# Patient Record
Sex: Female | Born: 1975 | Race: Black or African American | Hispanic: No | Marital: Single | Smoking: Never smoker
Health system: Southern US, Community
[De-identification: ages and names within clinical notes are randomized; demographics above are authoritative.]

## PROBLEM LIST (undated history)

## (undated) DIAGNOSIS — R011 Cardiac murmur, unspecified: Secondary | ICD-10-CM

## (undated) DIAGNOSIS — I10 Essential (primary) hypertension: Secondary | ICD-10-CM

## (undated) HISTORY — DX: Cardiac murmur, unspecified: R01.1

## (undated) HISTORY — PX: ABDOMINAL HYSTERECTOMY: SHX81

---

## 2001-12-21 ENCOUNTER — Other Ambulatory Visit: Admission: RE | Admit: 2001-12-21 | Discharge: 2001-12-21 | Payer: Self-pay

## 2002-01-16 ENCOUNTER — Emergency Department (HOSPITAL_COMMUNITY): Admission: EM | Admit: 2002-01-16 | Discharge: 2002-01-16 | Payer: Self-pay | Admitting: Emergency Medicine

## 2003-02-28 ENCOUNTER — Other Ambulatory Visit: Admission: RE | Admit: 2003-02-28 | Discharge: 2003-02-28 | Payer: Self-pay

## 2008-06-02 ENCOUNTER — Ambulatory Visit: Payer: Self-pay | Admitting: Physician Assistant

## 2008-06-02 ENCOUNTER — Inpatient Hospital Stay (HOSPITAL_COMMUNITY): Admission: AD | Admit: 2008-06-02 | Discharge: 2008-06-02 | Payer: Self-pay | Admitting: Obstetrics & Gynecology

## 2009-09-20 ENCOUNTER — Encounter: Payer: Self-pay | Admitting: Cardiology

## 2010-09-02 LAB — URINALYSIS, ROUTINE W REFLEX MICROSCOPIC
Bilirubin Urine: NEGATIVE
Glucose, UA: NEGATIVE mg/dL
Ketones, ur: NEGATIVE mg/dL
Nitrite: NEGATIVE
Protein, ur: NEGATIVE mg/dL
Specific Gravity, Urine: 1.015 (ref 1.005–1.030)
Urobilinogen, UA: 0.2 mg/dL (ref 0.0–1.0)
pH: 5.5 (ref 5.0–8.0)

## 2010-09-02 LAB — GC/CHLAMYDIA PROBE AMP, GENITAL
Chlamydia, DNA Probe: NEGATIVE
GC Probe Amp, Genital: NEGATIVE

## 2010-09-02 LAB — URINE MICROSCOPIC-ADD ON

## 2010-09-02 LAB — WET PREP, GENITAL: Yeast Wet Prep HPF POC: NONE SEEN

## 2010-09-02 LAB — POCT PREGNANCY, URINE: Preg Test, Ur: NEGATIVE

## 2011-08-27 ENCOUNTER — Encounter: Payer: Self-pay | Admitting: *Deleted

## 2013-06-12 ENCOUNTER — Encounter (HOSPITAL_BASED_OUTPATIENT_CLINIC_OR_DEPARTMENT_OTHER): Payer: Self-pay | Admitting: Emergency Medicine

## 2013-06-12 ENCOUNTER — Emergency Department (HOSPITAL_BASED_OUTPATIENT_CLINIC_OR_DEPARTMENT_OTHER)
Admission: EM | Admit: 2013-06-12 | Discharge: 2013-06-12 | Disposition: A | Discharge status: Home / Self Care | Payer: Medicaid Other | Attending: Emergency Medicine | Admitting: Emergency Medicine

## 2013-06-12 DIAGNOSIS — IMO0002 Reserved for concepts with insufficient information to code with codable children: Secondary | ICD-10-CM | POA: Insufficient documentation

## 2013-06-12 DIAGNOSIS — M2550 Pain in unspecified joint: Secondary | ICD-10-CM | POA: Insufficient documentation

## 2013-06-12 DIAGNOSIS — J111 Influenza due to unidentified influenza virus with other respiratory manifestations: Secondary | ICD-10-CM | POA: Insufficient documentation

## 2013-06-12 DIAGNOSIS — R112 Nausea with vomiting, unspecified: Secondary | ICD-10-CM | POA: Insufficient documentation

## 2013-06-12 DIAGNOSIS — IMO0001 Reserved for inherently not codable concepts without codable children: Secondary | ICD-10-CM | POA: Insufficient documentation

## 2013-06-12 DIAGNOSIS — I10 Essential (primary) hypertension: Secondary | ICD-10-CM | POA: Insufficient documentation

## 2013-06-12 DIAGNOSIS — R000 Tachycardia, unspecified: Secondary | ICD-10-CM | POA: Insufficient documentation

## 2013-06-12 DIAGNOSIS — B349 Viral infection, unspecified: Secondary | ICD-10-CM

## 2013-06-12 DIAGNOSIS — R6889 Other general symptoms and signs: Secondary | ICD-10-CM

## 2013-06-12 DIAGNOSIS — R011 Cardiac murmur, unspecified: Secondary | ICD-10-CM | POA: Insufficient documentation

## 2013-06-12 DIAGNOSIS — B9789 Other viral agents as the cause of diseases classified elsewhere: Secondary | ICD-10-CM | POA: Insufficient documentation

## 2013-06-12 DIAGNOSIS — Z79899 Other long term (current) drug therapy: Secondary | ICD-10-CM | POA: Insufficient documentation

## 2013-06-12 DIAGNOSIS — Z91040 Latex allergy status: Secondary | ICD-10-CM | POA: Insufficient documentation

## 2013-06-12 HISTORY — DX: Essential (primary) hypertension: I10

## 2013-06-12 MED ORDER — FLUTICASONE PROPIONATE 50 MCG/ACT NA SUSP: 2.0000 | Freq: Every day | NASAL | Status: DC

## 2013-06-12 MED ORDER — BENZONATATE 100 MG PO CAPS: 100.0000 mg | ORAL_CAPSULE | Freq: Three times a day (TID) | ORAL | Status: DC

## 2013-06-12 NOTE — Discharge Instructions (Signed)
Take tessalon as directed as needed for cough. Use flonase as directed. Rest, stay well hydrated. Alternate tylenol and ibuprofen every 6 hours for fever.  Influenza, Adult Influenza ("the flu") is a viral infection of the respiratory tract. It occurs more often in winter months because people spend more time in close contact with one another. Influenza can make you feel very sick. Influenza easily spreads from person to person (contagious). CAUSES  Influenza is caused by a virus that infects the respiratory tract. You can catch the virus by breathing in droplets from an infected person's cough or sneeze. You can also catch the virus by touching something that was recently contaminated with the virus and then touching your mouth, nose, or eyes. SYMPTOMS  Symptoms typically last 4 to 10 days and may include:  Fever.  Chills.  Headache, body aches, and muscle aches.  Sore throat.  Chest discomfort and cough.  Poor appetite.  Weakness or feeling tired.  Dizziness.  Nausea or vomiting. DIAGNOSIS  Diagnosis of influenza is often made based on your history and a physical exam. A nose or throat swab test can be done to confirm the diagnosis. RISKS AND COMPLICATIONS You may be at risk for a more severe case of influenza if you smoke cigarettes, have diabetes, have chronic heart disease (such as heart failure) or lung disease (such as asthma), or if you have a weakened immune system. Elderly people and pregnant women are also at risk for more serious infections. The most common complication of influenza is a lung infection (pneumonia). Sometimes, this complication can require emergency medical care and may be life-threatening. PREVENTION  An annual influenza vaccination (flu shot) is the best way to avoid getting influenza. An annual flu shot is now routinely recommended for all adults in the [place] TREATMENT  In mild cases, influenza goes away on its own. Treatment is directed at relieving  symptoms. For more severe cases, your caregiver may prescribe antiviral medicines to shorten the sickness. Antibiotic medicines are not effective, because the infection is caused by a virus, not by bacteria. HOME CARE INSTRUCTIONS  Only take over-the-counter or prescription medicines for pain, discomfort, or fever as directed by your caregiver.  Use a cool mist humidifier to make breathing easier.  Get plenty of rest until your temperature returns to normal. This usually takes 3 to 4 days.  Drink enough fluids to keep your urine clear or pale yellow.  Cover your mouth and nose when coughing or sneezing, and wash your hands well to avoid spreading the virus.  Stay home from work or school until your fever has been gone for at least 1 full day. SEEK MEDICAL CARE IF:   You have chest pain or a deep cough that worsens or produces more mucus.  You have nausea, vomiting, or diarrhea. SEEK IMMEDIATE MEDICAL CARE IF:   You have difficulty breathing, shortness of breath, or your skin or nails turn bluish.  You have severe neck pain or stiffness.  You have a severe headache, facial pain, or earache.  You have a worsening or recurring fever.  You have nausea or vomiting that cannot be controlled. MAKE SURE YOU:  Understand these instructions.  Will watch your condition.  Will get help right away if you are not doing well or get worse. Document Released: 05/02/2000 Document Revised: 11/04/2011 Document Reviewed: 08/04/2011 [facility] Patient Information 2014 [place], [place].  Viral Infections A viral infection can be caused by different types of viruses.Most viral infections are not serious  and resolve on their own. However, some infections may cause severe symptoms and may lead to further complications. SYMPTOMS Viruses can frequently cause:  Minor sore throat.  Aches and pains.  Headaches.  Runny nose.  Different types of rashes.  Watery  eyes.  Tiredness.  Cough.  Loss of appetite.  Gastrointestinal infections, resulting in nausea, vomiting, and diarrhea. These symptoms do not respond to antibiotics because the infection is not caused by bacteria. However, you might catch a bacterial infection following the viral infection. This is sometimes called a "superinfection." Symptoms of such a bacterial infection may include:  Worsening sore throat with pus and difficulty swallowing.  Swollen neck glands.  Chills and a high or persistent fever.  Severe headache.  Tenderness over the sinuses.  Persistent overall ill feeling (malaise), muscle aches, and tiredness (fatigue).  Persistent cough.  Yellow, green, or brown mucus production with coughing. HOME CARE INSTRUCTIONS   Only take over-the-counter or prescription medicines for pain, discomfort, diarrhea, or fever as directed by your caregiver.  Drink enough water and fluids to keep your urine clear or pale yellow. Sports drinks can provide valuable electrolytes, sugars, and hydration.  Get plenty of rest and maintain proper nutrition. Soups and broths with crackers or rice are fine. SEEK IMMEDIATE MEDICAL CARE IF:   You have severe headaches, shortness of breath, chest pain, neck pain, or an unusual rash.  You have uncontrolled vomiting, diarrhea, or you are unable to keep down fluids.  You or your child has an oral temperature above 102 F (38.9 C), not controlled by medicine.  Your baby is older than 3 months with a rectal temperature of 102 F (38.9 C) or higher.  Your baby is 47 months old or younger with a rectal temperature of 100.4 F (38 C) or higher. MAKE SURE YOU:   Understand these instructions.  Will watch your condition.  Will get help right away if you are not doing well or get worse. Document Released: 02/12/2005 Document Revised: 07/28/2011 Document Reviewed: 09/09/2010 [facility] Patient Information 2014 [place], [place].

## 2013-06-12 NOTE — ED Notes (Signed)
Friday was having N/V and now headache, cough, congestion, general body aches, and fever

## 2013-06-12 NOTE — ED Notes (Signed)
Pt complains of GI symptoms on fri and sat but nothing since.  Now complains of body aches and cough.  Denies n/v/d at this time.

## 2013-06-12 NOTE — ED Provider Notes (Signed)
CSN: [id number]     Arrival date & time 06/12/13  2119 History   First MD Initiated Contact with Patient 06/12/13 2120     Chief Complaint  Patient presents with  . Influenza   (Consider location/radiation/quality/duration/timing/severity/associated sxs/prior Treatment) HPI Comments: Pt is a 38 y/o female who presents to the ED complaining of generalized body aches, fever, n/v, cough and congestion x 3 days. Pt states she initially had n/v 3 days ago, had a few episodes yesterday and has since subsided she has no appetite. Yesterday she began to feel congested in her chest, cough and body aches. 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 She has tried OTC medications with minimal relief. Denies cp, sob, diarrhea, abdominal pain.  Patient is a 38 y.o. female presenting with flu symptoms. The history is provided by the patient.  Influenza Presenting symptoms: cough, fatigue, fever, myalgias, nausea and vomiting   Associated symptoms: nasal congestion     Past Medical History  Diagnosis Date  . Heart murmur   . Hypertension    History reviewed. No pertinent past surgical history. Family History  Problem Relation Age of Onset  . Hypertension Mother   . Hypertension Father    History  Substance Use Topics  . Smoking status: Never Smoker   . Smokeless tobacco: Not on file  . Alcohol Use: No   OB History   Grav Para Term Preterm Abortions TAB SAB Ect Mult Living                 Review of Systems  Constitutional: Positive for fever, appetite change and fatigue.  HENT: Positive for congestion.   Respiratory: Positive for cough.   Gastrointestinal: Positive for nausea and vomiting.  Musculoskeletal: Positive for arthralgias and myalgias.  All other systems reviewed and are negative.    Allergies  Latex and Sulfa antibiotics  Home Medications   Current Outpatient Rx  Name  Route  Sig  Dispense  Refill  . lisinopril-hydrochlorothiazide (PRINZIDE,ZESTORETIC) 20-25 MG per  tablet   Oral   Take 1 tablet by mouth daily.         . meclizine (ANTIVERT) 12.5 MG tablet   Oral   Take 12.5 mg by mouth 3 (three) times daily as needed for dizziness.         . megestrol (MEGACE) 40 MG tablet   Oral   Take 40 mg by mouth daily.         . Multiple Vitamin (MULTIVITAMIN) capsule   Oral   Take 1 capsule by mouth daily.         . benzonatate (TESSALON) 100 MG capsule   Oral   Take 1 capsule (100 mg total) by mouth every 8 (eight) hours.   21 capsule   0   . fluticasone (FLONASE) 50 MCG/ACT nasal spray   Each Nare   Place 2 sprays into both nostrils daily.   16 g   0    BP 125/77  Pulse 112  Temp(Src) 100.7 F (38.2 C) (Oral)  Resp 16  Ht 5\' 7"  (1.702 m)  Wt 213 lb (96.616 kg)  BMI 33.35 kg/m2  SpO2 99% Physical Exam  Nursing note and vitals reviewed. Constitutional: She is oriented to person, place, and time. She appears well-developed and well-nourished. No distress.  HENT:  Head: Normocephalic and atraumatic.  Nose: Mucosal edema present. Right sinus exhibits maxillary sinus tenderness and frontal sinus tenderness. Left sinus exhibits maxillary sinus tenderness and frontal sinus tenderness.  Mouth/Throat: Mucous membranes are normal. Posterior oropharyngeal erythema present. No oropharyngeal exudate or posterior oropharyngeal edema.  Eyes: Conjunctivae are normal.  Neck: Normal range of motion. Neck supple.  Cardiovascular: Regular rhythm and normal heart sounds.  Tachycardia present.   Pulmonary/Chest: Effort normal and breath sounds normal. No respiratory distress. She has no wheezes. She has no rhonchi.  Abdominal: Soft. Normal appearance and bowel sounds are normal. She exhibits no distension. There is no tenderness. There is no rigidity, no rebound and no guarding.  Musculoskeletal: Normal range of motion. She exhibits no edema.  Neurological: She is alert and oriented to person, place, and time.  Skin: Skin is warm and dry. She is  not diaphoretic.  Psychiatric: She has a normal mood and affect. Her behavior is normal.    ED Course  Procedures (including critical care time) Labs Review Labs Reviewed - No data to display Imaging Review No results found.  EKG Interpretation   None       MDM   1. Flu-like symptoms   2. Viral syndrome    Pt presenting with flu-like symptoms. She is well appearing and in NAD. Febrile at 100.7, tachycardic ~110. Lungs clear. O2 sat 99% on RA. Tolerating PO fluids without difficulty. Discussed symptomatic tx. Flonase and tessalon given. She is stable for discharge. Return precautions given. Patient states understanding of treatment care plan and is agreeable.     Trevor Mace, PA-C 06/12/13 2146

## 2013-06-13 NOTE — ED Provider Notes (Signed)
Medical screening examination/treatment/procedure(s) were performed by non-physician practitioner and as supervising physician I was immediately available for consultation/collaboration.  EKG Interpretation   None         Shelda JakesScott W. Bryndon Cumbie, MD 06/13/13 [phone]

## 2013-06-23 ENCOUNTER — Encounter: Payer: Self-pay | Admitting: Cardiology

## 2013-08-03 ENCOUNTER — Emergency Department (HOSPITAL_BASED_OUTPATIENT_CLINIC_OR_DEPARTMENT_OTHER): Payer: Medicaid Other

## 2013-08-03 ENCOUNTER — Emergency Department (HOSPITAL_BASED_OUTPATIENT_CLINIC_OR_DEPARTMENT_OTHER)
Admission: EM | Admit: 2013-08-03 | Discharge: 2013-08-03 | Disposition: A | Discharge status: Home / Self Care | Payer: Medicaid Other | Attending: Emergency Medicine | Admitting: Emergency Medicine

## 2013-08-03 DIAGNOSIS — IMO0002 Reserved for concepts with insufficient information to code with codable children: Secondary | ICD-10-CM | POA: Insufficient documentation

## 2013-08-03 DIAGNOSIS — R609 Edema, unspecified: Secondary | ICD-10-CM | POA: Insufficient documentation

## 2013-08-03 DIAGNOSIS — Z91040 Latex allergy status: Secondary | ICD-10-CM | POA: Insufficient documentation

## 2013-08-03 DIAGNOSIS — R011 Cardiac murmur, unspecified: Secondary | ICD-10-CM | POA: Insufficient documentation

## 2013-08-03 DIAGNOSIS — M79609 Pain in unspecified limb: Secondary | ICD-10-CM | POA: Insufficient documentation

## 2013-08-03 DIAGNOSIS — Z79899 Other long term (current) drug therapy: Secondary | ICD-10-CM | POA: Insufficient documentation

## 2013-08-03 DIAGNOSIS — E876 Hypokalemia: Secondary | ICD-10-CM | POA: Insufficient documentation

## 2013-08-03 DIAGNOSIS — I10 Essential (primary) hypertension: Secondary | ICD-10-CM | POA: Insufficient documentation

## 2013-08-03 DIAGNOSIS — M79606 Pain in leg, unspecified: Secondary | ICD-10-CM

## 2013-08-03 LAB — BASIC METABOLIC PANEL
BUN: 15 mg/dL (ref 6–23)
CALCIUM: 9.8 mg/dL (ref 8.4–10.5)
CO2: 28 mEq/L (ref 19–32)
Chloride: 95 mEq/L — ABNORMAL LOW (ref 96–112)
Creatinine, Ser: 0.8 mg/dL (ref 0.50–1.10)
Glucose, Bld: 113 mg/dL — ABNORMAL HIGH (ref 70–99)
POTASSIUM: 2.9 meq/L — AB (ref 3.7–5.3)
SODIUM: 138 meq/L (ref 137–147)

## 2013-08-03 LAB — D-DIMER, QUANTITATIVE (NOT AT ARMC): D DIMER QUANT: 1.87 ug{FEU}/mL — AB (ref 0.00–0.48)

## 2013-08-03 MED ORDER — POTASSIUM CHLORIDE 10 MEQ/100ML IV SOLN
INTRAVENOUS | Status: AC
  Administered 2013-08-03: 10 meq via INTRAVENOUS
  Filled 2013-08-03: qty 100

## 2013-08-03 MED ORDER — POTASSIUM CHLORIDE 20 MEQ/15ML (10%) PO LIQD
40.0000 meq | Freq: Once | ORAL | Status: AC
  Administered 2013-08-03: 40 meq via ORAL
  Filled 2013-08-03: qty 30

## 2013-08-03 MED ORDER — POTASSIUM CHLORIDE ER 10 MEQ PO TBCR: 10.0000 meq | EXTENDED_RELEASE_TABLET | Freq: Every day | ORAL | Status: DC

## 2013-08-03 MED ORDER — POTASSIUM CHLORIDE 10 MEQ/100ML IV SOLN
10.0000 meq | Freq: Once | INTRAVENOUS | Status: AC
  Administered 2013-08-03: 10 meq via INTRAVENOUS

## 2013-08-03 NOTE — ED Notes (Signed)
Pt sts she has hx of "sweling in legs", has been taking "water pills" and was told that if she had any pain in legs to come get checked for blood clots. Pain is in bilateral legs, described as sharp, in calves. R greater than L. Denies shob.

## 2013-08-03 NOTE — ED Notes (Signed)
Pt has bilateral 2+ pitting edema to lower legs and feet. Denies shob. No erythema, warmth to touch noted to lower legs.

## 2013-08-03 NOTE — Discharge Instructions (Signed)
Hypokalemia Hypokalemia means that the amount of potassium in the blood is lower than normal.Potassium is a chemical, called an electrolyte, that helps regulate the amount of fluid in the body. It also stimulates muscle contraction and helps nerves function properly.Most of the body's potassium is inside of cells, and only a very small amount is in the blood. Because the amount in the blood is so small, minor changes can be life-threatening. CAUSES  Antibiotics.  Diarrhea or vomiting.  Using laxatives too much, which can cause diarrhea.  Chronic kidney disease.  Water pills (diuretics).  Eating disorders (bulimia).  Low magnesium level.  Sweating a lot. SIGNS AND SYMPTOMS  Weakness.  Constipation.  Fatigue.  Muscle cramps.  Mental confusion.  Skipped heartbeats or irregular heartbeat (palpitations).  Tingling or numbness. DIAGNOSIS  Your health care provider can diagnose hypokalemia with blood tests. In addition to checking your potassium level, your health care provider may also check other lab tests. TREATMENT Hypokalemia can be treated with potassium supplements taken by mouth or adjustments in your current medicines. If your potassium level is very low, you may need to get potassium through a vein (IV) and be monitored in the hospital. A diet high in potassium is also helpful. Foods high in potassium are:  Nuts, such as peanuts and pistachios.  Seeds, such as sunflower seeds and pumpkin seeds.  Peas, lentils, and lima beans.  Whole grain and bran cereals and breads.  Fresh fruit and vegetables, such as apricots, avocado, bananas, cantaloupe, kiwi, oranges, tomatoes, asparagus, and potatoes.  Orange and tomato juices.  Red meats.  Fruit yogurt. HOME CARE INSTRUCTIONS  Take all medicines as prescribed by your health care provider.  Maintain a healthy diet by including nutritious food, such as fruits, vegetables, nuts, whole grains, and lean meats.  If  you are taking a laxative, be sure to follow the directions on the label. SEEK MEDICAL CARE IF:  Your weakness gets worse.  You feel your heart pounding or racing.  You are vomiting or having diarrhea.  You are diabetic and having trouble keeping your blood glucose in the normal range. SEEK IMMEDIATE MEDICAL CARE IF:  You have chest pain, shortness of breath, or dizziness.  You are vomiting or having diarrhea for more than 2 days.  You faint. MAKE SURE YOU:   Understand these instructions.  Will watch your condition.  Will get help right away if you are not doing well or get worse. Document Released: 05/05/2005 Document Revised: 02/23/2013 Document Reviewed: 11/05/2012 [employer] Patient Information 2014 [employer].  

## 2013-08-03 NOTE — ED Notes (Signed)
Lab called ot notify of critical potassium 2.9, Dr Wilkie AyeHorton made aware.

## 2013-08-03 NOTE — ED Notes (Signed)
Critical potassium called by lab, Dr Wilkie AyeHorton notified. K+ 2.9.

## 2013-08-03 NOTE — ED Provider Notes (Signed)
CSN: [id number]     Arrival date & time 08/03/13  0830 History   First MD Initiated Contact with Patient 08/03/13 [phone]     Chief Complaint  Patient presents with  . Leg Pain     (Consider location/radiation/quality/duration/timing/severity/associated sxs/prior Treatment) HPI  This is a 38 year old female who presents with bilateral leg pain and swelling. Patient reports she's been seen by her primary physician for increasing leg swelling since November. She was seen last week and her dose of Lasix was increased. Patient reports improvement of swelling since last week. However, patient reports over the last 24 hours she has noted sharp pains in the bilateral lower extremities. She denies any skin changes. She was told if she noted any pain or skin changes, she needs to be evaluated for clot. Patient reports that the pain is sharp and nonradiating. Currently the pain is 4/10. She denies any chest pain or shortness of breath. She denies any injury.  Past Medical History  Diagnosis Date  . Heart murmur   . Hypertension    No past surgical history on file. Family History  Problem Relation Age of Onset  . Hypertension Mother   . Hypertension Father    History  Substance Use Topics  . Smoking status: Never Smoker   . Smokeless tobacco: Not on file  . Alcohol Use: No   OB History   Grav Para Term Preterm Abortions TAB SAB Ect Mult Living                 Review of Systems  Constitutional: Negative for fever.  Respiratory: Negative for chest tightness and shortness of breath.   Cardiovascular: Positive for leg swelling. Negative for chest pain and palpitations.  Genitourinary: Negative for dysuria.  Skin: Negative for color change and rash.  Neurological: Negative for headaches.  All other systems reviewed and are negative.      Allergies  Latex and Sulfa antibiotics  Home Medications   Current Outpatient Rx  Name  Route  Sig  Dispense  Refill  . furosemide (LASIX) 40 MG  tablet   Oral   Take 40 mg by mouth 2 (two) times daily.         . benzonatate (TESSALON) 100 MG capsule   Oral   Take 1 capsule (100 mg total) by mouth every 8 (eight) hours.   21 capsule   0   . fluticasone (FLONASE) 50 MCG/ACT nasal spray   Each Nare   Place 2 sprays into both nostrils daily.   16 g   0   . lisinopril-hydrochlorothiazide (PRINZIDE,ZESTORETIC) 20-25 MG per tablet   Oral   Take 1 tablet by mouth daily.         . meclizine (ANTIVERT) 12.5 MG tablet   Oral   Take 12.5 mg by mouth 3 (three) times daily as needed for dizziness.         . megestrol (MEGACE) 40 MG tablet   Oral   Take 40 mg by mouth daily.         . Multiple Vitamin (MULTIVITAMIN) capsule   Oral   Take 1 capsule by mouth daily.         . potassium chloride (K-DUR) 10 MEQ tablet   Oral   Take 1 tablet (10 mEq total) by mouth daily.   30 tablet   0    BP 125/69  Pulse 89  Temp(Src) 97.7 F (36.5 C) (Oral)  Resp 16  Ht 5\' 6"  (1.676 m)  Wt 232 lb (105.235 kg)  BMI 37.46 kg/m2  SpO2 99% Physical Exam  Nursing note and vitals reviewed. Constitutional: She is oriented to person, place, and time. She appears well-developed and well-nourished. No distress.  HENT:  Head: Normocephalic and atraumatic.  Cardiovascular: Normal rate, regular rhythm and normal heart sounds.   No murmur heard. Pulmonary/Chest: Effort normal and breath sounds normal. No respiratory distress. She has no wheezes.  Abdominal: Soft. There is no tenderness.  Musculoskeletal: She exhibits edema.  2+ bilateral lower extremity edema conditions, symmetric, no significant calf tenderness  Neurological: She is alert and oriented to person, place, and time.  Skin: Skin is warm and dry.  Psychiatric: She has a normal mood and affect.    ED Course  Procedures (including critical care time) Labs Review Labs Reviewed  D-DIMER, QUANTITATIVE - Abnormal; Notable for the following:    D-Dimer, Quant 1.87 (*)     All other components within normal limits  BASIC METABOLIC PANEL - Abnormal; Notable for the following:    Potassium 2.9 (*)    Chloride 95 (*)    Glucose, Bld 113 (*)    All other components within normal limits   Imaging Review US Venous Img Lower Bilateral  08/03/2013   CLINICAL DATA:  Bilateral lower extremity pain and swelling  EXAM: Bilateral LOWER EXTREMITY VENOUS DOPPLER ULTRASOUND  TECHNIQUE: Gray-scale sonography with graded compression, as well as color Doppler and duplex ultrasound, were performed to evaluate the deep venous system from the level of the common femoral vein through the popliteal and proximal calf veins. Spectral Doppler was utilized to evaluate flow at rest and with distal augmentation maneuvers.  COMPARISON:  None.  FINDINGS: Thrombus within deep veins:  None visualized.  Compressibility of deep veins:  Normal.  Duplex waveform respiratory phasicity:  Normal.  Duplex waveform response to augmentation:  Normal.  Venous reflux:  None visualized.  Other findings: Incidental note is made of superficial edema associated with the left ankle.  IMPRESSION: There is no evidence of deep venous thrombosis in the right or left lower extremities.   Electronically Signed   By: David  [place]   On: 08/03/2013 10:56     EKG Interpretation None      MDM   Final diagnoses:  Leg pain  Hypokalemia    Patient presents with bilateral leg pain. Patient recently had diuretics increased by PCP. Was told to be evaluated for blood clots. Low suspicion for blood clots given the bilateral nature of symptoms. D-dimer was sent as well as a BMP to elevate for potassium abnormalities. Dimer is elevated and patient sent for ultrasound which is negative for DVT. Potassium noted to be 2.9. This is likely the cause of patient's lower extremity myalgias. Patient was given potassium by IV and PO.  Will start patient on K-Dur. Patient followup primary care physician for repeat BMP in one week.  After  history, exam, and medical workup I feel the patient has been appropriately medically screened and is safe for discharge home. Pertinent diagnoses were discussed with the patient. Patient was given return precautions.     Shon Baton, MD 08/03/13 [phone]

## 2013-11-22 ENCOUNTER — Ambulatory Visit (HOSPITAL_COMMUNITY)
Admission: RE | Admit: 2013-11-22 | Discharge: 2013-11-22 | Disposition: A | Discharge status: Home / Self Care | Payer: Medicaid Other | Source: Ambulatory Visit | Attending: Cardiology | Admitting: Cardiology

## 2013-11-22 DIAGNOSIS — I517 Cardiomegaly: Secondary | ICD-10-CM

## 2013-11-22 DIAGNOSIS — R609 Edema, unspecified: Secondary | ICD-10-CM | POA: Insufficient documentation

## 2013-11-22 NOTE — Progress Notes (Signed)
  Echocardiogram 2D Echocardiogram has been performed.  Rhea Thrun 11/22/2013, 9:02 AM

## 2014-08-01 ENCOUNTER — Emergency Department (HOSPITAL_BASED_OUTPATIENT_CLINIC_OR_DEPARTMENT_OTHER)
Admission: EM | Admit: 2014-08-01 | Discharge: 2014-08-01 | Disposition: A | Discharge status: Home / Self Care | Payer: 59 | Attending: Emergency Medicine | Admitting: Emergency Medicine

## 2014-08-01 ENCOUNTER — Encounter (HOSPITAL_BASED_OUTPATIENT_CLINIC_OR_DEPARTMENT_OTHER): Payer: Self-pay | Admitting: *Deleted

## 2014-08-01 ENCOUNTER — Emergency Department (HOSPITAL_BASED_OUTPATIENT_CLINIC_OR_DEPARTMENT_OTHER): Payer: 59

## 2014-08-01 DIAGNOSIS — M25562 Pain in left knee: Secondary | ICD-10-CM

## 2014-08-01 DIAGNOSIS — M25569 Pain in unspecified knee: Secondary | ICD-10-CM

## 2014-08-01 DIAGNOSIS — G8929 Other chronic pain: Secondary | ICD-10-CM | POA: Insufficient documentation

## 2014-08-01 DIAGNOSIS — S8992XA Unspecified injury of left lower leg, initial encounter: Secondary | ICD-10-CM | POA: Insufficient documentation

## 2014-08-01 DIAGNOSIS — Z79818 Long term (current) use of other agents affecting estrogen receptors and estrogen levels: Secondary | ICD-10-CM | POA: Insufficient documentation

## 2014-08-01 DIAGNOSIS — R011 Cardiac murmur, unspecified: Secondary | ICD-10-CM | POA: Diagnosis not present

## 2014-08-01 DIAGNOSIS — Z79899 Other long term (current) drug therapy: Secondary | ICD-10-CM | POA: Insufficient documentation

## 2014-08-01 DIAGNOSIS — Y998 Other external cause status: Secondary | ICD-10-CM | POA: Diagnosis not present

## 2014-08-01 DIAGNOSIS — Z7951 Long term (current) use of inhaled steroids: Secondary | ICD-10-CM | POA: Diagnosis not present

## 2014-08-01 DIAGNOSIS — Y9301 Activity, walking, marching and hiking: Secondary | ICD-10-CM | POA: Diagnosis not present

## 2014-08-01 DIAGNOSIS — X58XXXA Exposure to other specified factors, initial encounter: Secondary | ICD-10-CM | POA: Insufficient documentation

## 2014-08-01 DIAGNOSIS — Z91040 Latex allergy status: Secondary | ICD-10-CM | POA: Insufficient documentation

## 2014-08-01 DIAGNOSIS — Y9289 Other specified places as the place of occurrence of the external cause: Secondary | ICD-10-CM | POA: Insufficient documentation

## 2014-08-01 DIAGNOSIS — I10 Essential (primary) hypertension: Secondary | ICD-10-CM | POA: Diagnosis not present

## 2014-08-01 MED ORDER — HYDROCODONE-ACETAMINOPHEN 5-325 MG PO TABS: 1.0000 | ORAL_TABLET | Freq: Four times a day (QID) | ORAL | Status: DC | PRN

## 2014-08-01 MED ORDER — IBUPROFEN 800 MG PO TABS: 800.0000 mg | ORAL_TABLET | Freq: Three times a day (TID) | ORAL | Status: DC

## 2014-08-01 NOTE — ED Provider Notes (Signed)
CSN: [id number]     Arrival date & time 08/01/14  2025 History  This chart was scribed for Elwin MochaBlair Jaedynn Bohlken, MD by Gwenyth Oberatherine Macek, ED Scribe. This patient was seen in room MH01/MH01 and the patient's care was started at 8:46 PM.    Chief Complaint  Patient presents with  . Knee Pain   Patient is a 39 y.o. female presenting with knee pain. The history is provided by the patient. No language interpreter was used.  Knee Pain Location:  Knee Time since incident:  8 days Injury: no   Knee location:  L knee Pain details:    Quality:  Aching   Radiates to:  Does not radiate   Severity:  Moderate   Onset quality:  Gradual   Duration:  8 days   Timing:  Constant   Progression:  Unchanged Chronicity:  New Dislocation: no   Foreign body present:  No foreign bodies Tetanus status:  Unknown Prior injury to area:  No Relieved by:  None tried Worsened by:  Nothing tried Ineffective treatments:  None tried Associated symptoms: no back pain, no numbness and no tingling     HPI Comments: Patricia [place] is a 39 y.o. female who presents to the Emergency Department complaining of constant, moderate, sharp left knee pain that started 8 days ago. She states bilateral leg swelling as an associated symptom. She also notes a creaking sounds from her right knee as she walks up the stairs. Pt reports pain becomes worse with bending and squatting. She works in [employer]. Pt denies estrogen use, recent long travel and injuries or falls.   Past Medical History  Diagnosis Date  . Heart murmur   . Hypertension    Past Surgical History  Procedure Laterality Date  . Abdominal hysterectomy     Family History  Problem Relation Age of Onset  . Hypertension Mother   . Hypertension Father    History  Substance Use Topics  . Smoking status: Never Smoker   . Smokeless tobacco: Not on file  . Alcohol Use: No   OB History    No data available     Review of Systems  Cardiovascular: Positive for leg  swelling.  Musculoskeletal: Positive for arthralgias. Negative for back pain.  All other systems reviewed and are negative.  Allergies  Latex and Sulfa antibiotics  Home Medications   Prior to Admission medications   Medication Sig Start Date End Date Taking? Authorizing Provider  benzonatate (TESSALON) 100 MG capsule Take 1 capsule (100 mg total) by mouth every 8 (eight) hours. 06/12/13   Robyn M Hess, PA-C  fluticasone (FLONASE) 50 MCG/ACT nasal spray Place 2 sprays into both nostrils daily. 06/12/13   Robyn M Hess, PA-C  furosemide (LASIX) 40 MG tablet Take 40 mg by mouth 2 (two) times daily.    Historical Provider, MD  lisinopril-hydrochlorothiazide (PRINZIDE,ZESTORETIC) 20-25 MG per tablet Take 1 tablet by mouth daily.    Historical Provider, MD  meclizine (ANTIVERT) 12.5 MG tablet Take 12.5 mg by mouth 3 (three) times daily as needed for dizziness.    Historical Provider, MD  megestrol (MEGACE) 40 MG tablet Take 40 mg by mouth daily.    Historical Provider, MD  Multiple Vitamin (MULTIVITAMIN) capsule Take 1 capsule by mouth daily.    Historical Provider, MD  potassium chloride (K-DUR) 10 MEQ tablet Take 1 tablet (10 mEq total) by mouth daily. 08/03/13   Shon Batonourtney F Horton, MD   BP 152/80 mmHg  Pulse 89  Temp(Src)  98.4 F (36.9 C) (Oral)  Resp 20  Ht  (1.702 m)  Wt 230 lb (104.327 kg)  BMI 36.01 kg/m2  SpO2 99% Physical Exam  Constitutional: She is oriented to person, place, and time. She appears well-developed and well-nourished. No distress.  HENT:  Head: Normocephalic and atraumatic.  Mouth/Throat: Oropharynx is clear and moist.  Eyes: EOM are normal. Pupils are equal, round, and reactive to light.  Neck: Normal range of motion. Neck supple.  Cardiovascular: Normal rate and regular rhythm.  Exam reveals no friction rub.   No murmur heard. Pulmonary/Chest: Effort normal and breath sounds normal. No respiratory distress. She has no wheezes. She has no rales.  Abdominal:  Soft. She exhibits no distension. There is no tenderness. There is no rebound.  Musculoskeletal: She exhibits no edema.       Left knee: She exhibits decreased range of motion. She exhibits no swelling, no effusion, no ecchymosis, no erythema and normal alignment. Tenderness (anterior, along lateral patella) found. Lateral joint line and patellar tendon tenderness noted. No medial joint line tenderness noted.  Neurological: She is alert and oriented to person, place, and time.  Skin: No rash noted. She is not diaphoretic.  Nursing note and vitals reviewed.   ED Course  Procedures   DIAGNOSTIC STUDIES: Oxygen Saturation is 99% on RA, normal by my interpretation.    COORDINATION OF CARE: 8:54 PM Discussed treatment plan with pt at bedside and pt agreed to plan.   Labs Review Labs Reviewed - No data to display  Imaging Review Dg Knee Complete 4 Views Left  08/01/2014   CLINICAL DATA:  Knee pain and swelling for 8 days.  EXAM: LEFT KNEE - COMPLETE 4+ VIEW  COMPARISON:  None.  FINDINGS: No acute fracture or dislocation. Chronic bone density at the tibial tubercle is noted. Unremarkable soft tissues.  IMPRESSION: No acute bony pathology.   Electronically Signed   By: Jolaine Click M.D.   On: 08/01/2014 21:36     EKG Interpretation None      MDM   Final diagnoses:  Knee pain    39 year old female here with left knee pain. No inciting event. Worse with being on it. She states right knee, but points left knee. Has had chronic right knee pain. Left knee pain is anterior at the patella and then laterally. No posterior knee pain. No lower extremity swelling distal to that. Patient states bilateral leg swelling, however none appreciated. Normal pulses, sensation, strength. On exam she has palpable tenderness of the patella. I do not think that patient has any concerning factors for DVT. Will x-ray. I suspect this is likely arthritis. X-ray normal. Stable for discharge.    I personally  performed the services described in this documentation, which was scribed in my presence. The recorded information has been reviewed and is accurate.     Elwin Mocha, MD 08/02/14 [phone]

## 2014-08-01 NOTE — Discharge Instructions (Signed)

## 2014-08-01 NOTE — ED Notes (Signed)
Right knee is swollen and painful. Started a week ago.

## 2014-08-01 NOTE — ED Notes (Signed)
MD at bedside. 

## 2014-08-17 ENCOUNTER — Emergency Department (HOSPITAL_BASED_OUTPATIENT_CLINIC_OR_DEPARTMENT_OTHER): Payer: 59

## 2014-08-17 ENCOUNTER — Encounter (HOSPITAL_BASED_OUTPATIENT_CLINIC_OR_DEPARTMENT_OTHER): Payer: Self-pay | Admitting: *Deleted

## 2014-08-17 ENCOUNTER — Emergency Department (HOSPITAL_BASED_OUTPATIENT_CLINIC_OR_DEPARTMENT_OTHER)
Admission: EM | Admit: 2014-08-17 | Discharge: 2014-08-17 | Disposition: A | Discharge status: Home / Self Care | Payer: 59 | Attending: Emergency Medicine | Admitting: Emergency Medicine

## 2014-08-17 DIAGNOSIS — Z7951 Long term (current) use of inhaled steroids: Secondary | ICD-10-CM | POA: Diagnosis not present

## 2014-08-17 DIAGNOSIS — I10 Essential (primary) hypertension: Secondary | ICD-10-CM | POA: Insufficient documentation

## 2014-08-17 DIAGNOSIS — Z79899 Other long term (current) drug therapy: Secondary | ICD-10-CM | POA: Diagnosis not present

## 2014-08-17 DIAGNOSIS — R011 Cardiac murmur, unspecified: Secondary | ICD-10-CM | POA: Diagnosis not present

## 2014-08-17 DIAGNOSIS — M25561 Pain in right knee: Secondary | ICD-10-CM | POA: Insufficient documentation

## 2014-08-17 DIAGNOSIS — Z791 Long term (current) use of non-steroidal anti-inflammatories (NSAID): Secondary | ICD-10-CM | POA: Diagnosis not present

## 2014-08-17 DIAGNOSIS — Z91040 Latex allergy status: Secondary | ICD-10-CM | POA: Diagnosis not present

## 2014-08-17 MED ORDER — IBUPROFEN 800 MG PO TABS: 800.0000 mg | ORAL_TABLET | Freq: Three times a day (TID) | ORAL | Status: DC

## 2014-08-17 MED ORDER — IBUPROFEN 800 MG PO TABS
800.0000 mg | ORAL_TABLET | Freq: Once | ORAL | Status: AC
  Administered 2014-08-17: 800 mg via ORAL
  Filled 2014-08-17: qty 1

## 2014-08-17 NOTE — ED Provider Notes (Signed)
CSN: [id number]     Arrival date & time 08/17/14  1018 History   First MD Initiated Contact with Patient 08/17/14 1053     Chief Complaint  Patient presents with  . Joint Swelling     (Consider location/radiation/quality/duration/timing/severity/associated sxs/prior Treatment) HPI  Pt presenting with right sided knee pain.  Pt was seen in the ED 08/01/14 for left knee pain- she states that she has been favoring her left knee and feels this has caused her to have right knee pain. She states pain is located behind right kneecap and that she hears a crunching sound when walking up and down stairs.  No injury or falls.  No fever or redness over knee.  No swelling.  She is able to bear weight.  There are no other associated systemic symptoms, there are no other alleviating or modifying factors.   Past Medical History  Diagnosis Date  . Heart murmur   . Hypertension    Past Surgical History  Procedure Laterality Date  . Abdominal hysterectomy     Family History  Problem Relation Age of Onset  . Hypertension Mother   . Hypertension Father    History  Substance Use Topics  . Smoking status: Never Smoker   . Smokeless tobacco: Never Used  . Alcohol Use: No   OB History    No data available     Review of Systems  ROS reviewed and all otherwise negative except for mentioned in HPI    Allergies  Sulfa antibiotics and Latex  Home Medications   Prior to Admission medications   Medication Sig Start Date End Date Taking? Authorizing Provider  furosemide (LASIX) 40 MG tablet Take 40 mg by mouth 2 (two) times daily.   Yes Historical Provider, MD  meclizine (ANTIVERT) 12.5 MG tablet Take 12.5 mg by mouth 3 (three) times daily as needed for dizziness.   Yes Historical Provider, MD  Multiple Vitamin (MULTIVITAMIN) capsule Take 1 capsule by mouth daily.   Yes Historical Provider, MD  benzonatate (TESSALON) 100 MG capsule Take 1 capsule (100 mg total) by mouth every 8 (eight) hours.  06/12/13   Robyn M Hess, PA-C  fluticasone (FLONASE) 50 MCG/ACT nasal spray Place 2 sprays into both nostrils daily. 06/12/13   Kathrynn Speed, PA-C  HYDROcodone-acetaminophen (NORCO/VICODIN) 5-325 MG per tablet Take 1 tablet by mouth every 6 (six) hours as needed for moderate pain. 08/01/14   Elwin Mocha, MD  ibuprofen (ADVIL,MOTRIN) 800 MG tablet Take 1 tablet (800 mg total) by mouth 3 (three) times daily. 08/17/14   Jerelyn Scott, MD  lisinopril-hydrochlorothiazide (PRINZIDE,ZESTORETIC) 20-25 MG per tablet Take 1 tablet by mouth daily.    Historical Provider, MD  megestrol (MEGACE) 40 MG tablet Take 40 mg by mouth daily.    Historical Provider, MD  potassium chloride (K-DUR) 10 MEQ tablet Take 1 tablet (10 mEq total) by mouth daily. 08/03/13   Shon Baton, MD   BP 142/84 mmHg  Pulse 71  Temp(Src) 98.2 F (36.8 C) (Oral)  Resp 18  Ht  (1.702 m)  Wt 220 lb (99.791 kg)  BMI 34.45 kg/m2  SpO2 100%  Vitals reviewed Physical Exam  Physical Examination: General appearance - alert, well appearing, and in no distress Mental status - alert, oriented to person, place, and time Eyes - no conjunctival injection, no scleral icterus Ears - bilateral TM's and external ear canals normal Chest - clear to auscultation, no wheezes, rales or rhonchi, symmetric air entry Heart -  normal rate, regular rhythm, normal S1, S2, no murmurs, rubs, clicks or gallops Neurological - alert, oriented, strength 5/5 in extremities, sensation intact Musculoskeletal - ttp in right knee- over patella, no effusion, no medial or lateral joint line tenderness, no joint tenderness, deformity or swelling Extremities - peripheral pulses normal, 2-3+ pitting edema bilaterally- chronic per patient, no clubbing or cyanosis Skin - normal coloration and turgor, no rashes, no suspicious skin lesions noted  ED Course  Procedures (including critical care time) Labs Review Labs Reviewed - No data to display  Imaging Review Dg  Knee Complete 4 Views Right  08/17/2014   CLINICAL DATA:  Pain, swelling for a week.  No known injury.  EXAM: RIGHT KNEE - COMPLETE 4+ VIEW  COMPARISON:  None.  FINDINGS: There is a small joint effusion. Early spurring in the patellofemoral compartment. No acute bony abnormality. Specifically, no fracture, subluxation, or dislocation. Soft tissues are intact.  IMPRESSION: Small joint effusion.  No acute bony abnormality.   Electronically Signed   By: Charlett NoseKevin  Dover M.D.   On: 08/17/2014 12:57     EKG Interpretation None      MDM   Final diagnoses:  Knee pain, acute, right    Pt presenting with pain in right knee.  No injury.  Suspect arthritis or patellofemoral syndrome.  Xray c/w early arthritis.  D/w patient, discussed quad strengthening to help with pain.  Rest, elevation.  Pt states ibuprofen works well for her.  Discharged with strict return precautions.  Pt agreeable with plan.  Xray images reviewed and interpreted by me as well.   Prior records reviewed and considered during this visit Nursing notes including past medical history and social history reviewed and considered in documentation     Jerelyn ScottMartha Linker, MD 08/18/14 1630

## 2014-08-17 NOTE — ED Notes (Signed)
Pt seen here 3/15 for left knee pain and LE swelling- states she has been favoring her right knee and now is having pain- BLE noted to have 3+ edema which pt states has been going on a while. She has Rx for lasix from her former PCP for this

## 2014-08-17 NOTE — Discharge Instructions (Signed)
Return to the ED with any concerns including increased pain, swelling of foot or leg, or any other alarming symptoms

## 2014-08-17 NOTE — ED Notes (Signed)
Patient transported to X-ray 

## 2015-05-23 IMAGING — DX DG KNEE COMPLETE 4+V*L*
1 series · 1 of 1 positions shown · non-contrast
Comparison: None.

CLINICAL DATA: Knee pain and swelling for 8 days.

EXAM:
LEFT KNEE - COMPLETE 4+ VIEW

[knee sunrise]
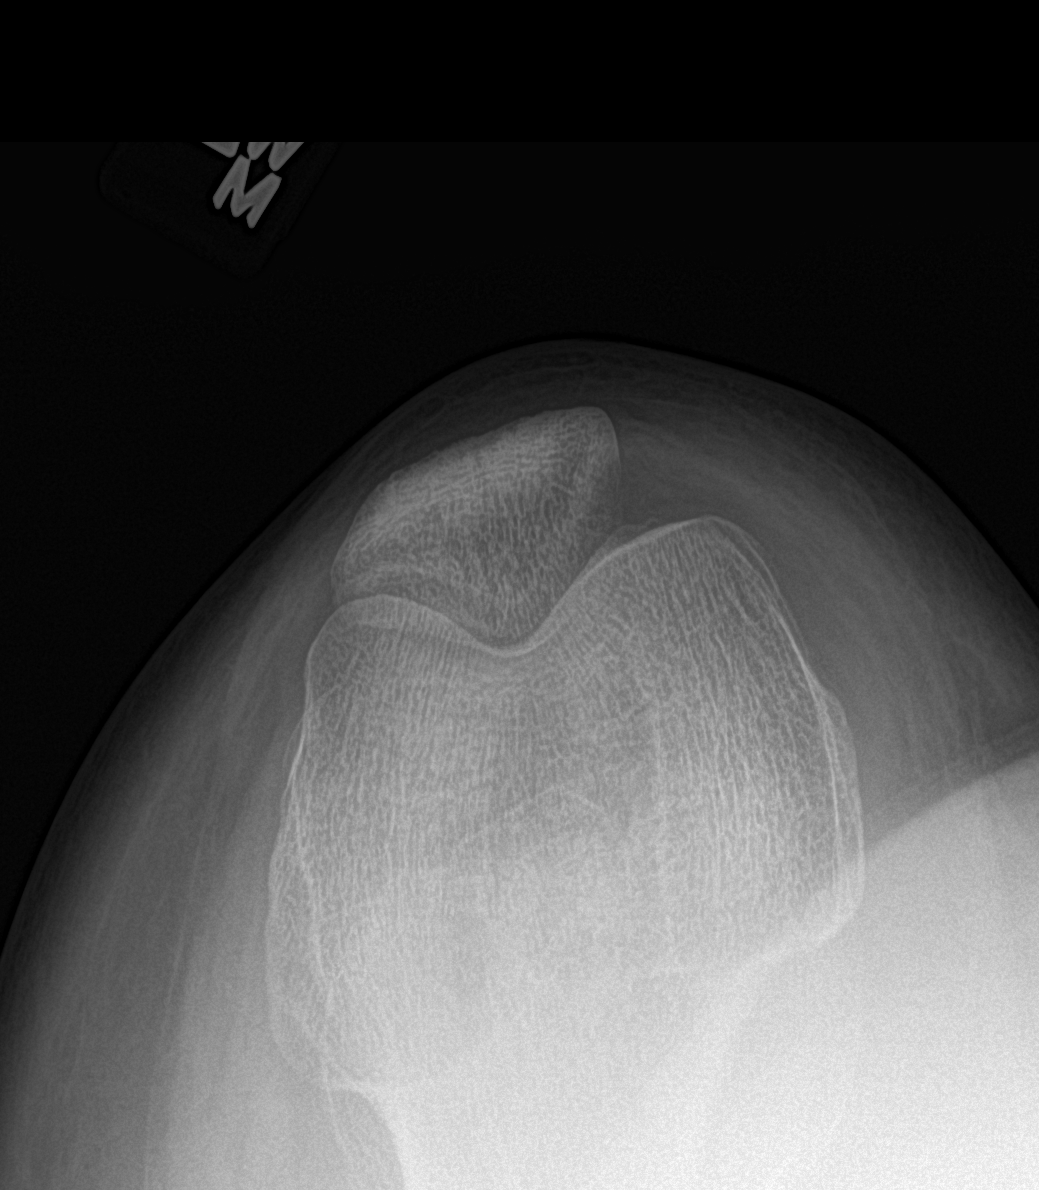

[1 of 1 positions shown; findings below may reference images not displayed]

FINDINGS: No acute fracture or dislocation. Chronic bone density at the tibial
tubercle is noted. Unremarkable soft tissues.
IMPRESSION: No acute bony pathology.

## 2017-10-20 ENCOUNTER — Emergency Department (HOSPITAL_BASED_OUTPATIENT_CLINIC_OR_DEPARTMENT_OTHER)
Admission: EM | Admit: 2017-10-20 | Discharge: 2017-10-20 | Disposition: A | Discharge status: Home / Self Care | Payer: Self-pay | Attending: Emergency Medicine | Admitting: Emergency Medicine

## 2017-10-20 ENCOUNTER — Encounter (HOSPITAL_BASED_OUTPATIENT_CLINIC_OR_DEPARTMENT_OTHER): Payer: Self-pay | Admitting: *Deleted

## 2017-10-20 ENCOUNTER — Other Ambulatory Visit: Payer: Self-pay

## 2017-10-20 DIAGNOSIS — Z91040 Latex allergy status: Secondary | ICD-10-CM | POA: Insufficient documentation

## 2017-10-20 DIAGNOSIS — Z9114 Patient's other noncompliance with medication regimen: Secondary | ICD-10-CM | POA: Insufficient documentation

## 2017-10-20 DIAGNOSIS — Z79899 Other long term (current) drug therapy: Secondary | ICD-10-CM | POA: Insufficient documentation

## 2017-10-20 DIAGNOSIS — I160 Hypertensive urgency: Secondary | ICD-10-CM | POA: Insufficient documentation

## 2017-10-20 LAB — URINALYSIS, ROUTINE W REFLEX MICROSCOPIC
BILIRUBIN URINE: NEGATIVE
Glucose, UA: NEGATIVE mg/dL
Ketones, ur: NEGATIVE mg/dL
LEUKOCYTES UA: NEGATIVE
NITRITE: NEGATIVE
Protein, ur: NEGATIVE mg/dL
SPECIFIC GRAVITY, URINE: 1.015 (ref 1.005–1.030)
pH: 7 (ref 5.0–8.0)

## 2017-10-20 LAB — CBC
HEMATOCRIT: 33.9 % — AB (ref 36.0–46.0)
HEMOGLOBIN: 11.7 g/dL — AB (ref 12.0–15.0)
MCH: 27.7 pg (ref 26.0–34.0)
MCHC: 34.5 g/dL (ref 30.0–36.0)
MCV: 80.1 fL (ref 78.0–100.0)
Platelets: 330 10*3/uL (ref 150–400)
RBC: 4.23 MIL/uL (ref 3.87–5.11)
RDW: 14.3 % (ref 11.5–15.5)
WBC: 11 10*3/uL — ABNORMAL HIGH (ref 4.0–10.5)

## 2017-10-20 LAB — BASIC METABOLIC PANEL
Anion gap: 7 (ref 5–15)
BUN: 8 mg/dL (ref 6–20)
CHLORIDE: 105 mmol/L (ref 101–111)
CO2: 25 mmol/L (ref 22–32)
CREATININE: 0.61 mg/dL (ref 0.44–1.00)
Calcium: 8.8 mg/dL — ABNORMAL LOW (ref 8.9–10.3)
GFR calc non Af Amer: >60 mL/min (ref >60)
Glucose, Bld: 97 mg/dL (ref 65–99)
Potassium: 3.5 mmol/L (ref 3.5–5.1)
Sodium: 137 mmol/L (ref 135–145)

## 2017-10-20 LAB — URINALYSIS, MICROSCOPIC (REFLEX)

## 2017-10-20 MED ORDER — HYDROCHLOROTHIAZIDE 25 MG PO TABS
25.0000 mg | ORAL_TABLET | Freq: Once | ORAL | Status: AC
  Administered 2017-10-20: 25 mg via ORAL
  Filled 2017-10-20: qty 1

## 2017-10-20 MED ORDER — LOSARTAN POTASSIUM 50 MG PO TABS: 50.0000 mg | ORAL_TABLET | Freq: Every day | ORAL | 1 refills | Status: DC

## 2017-10-20 MED ORDER — HYDROCHLOROTHIAZIDE 25 MG PO TABS: 25.0000 mg | ORAL_TABLET | Freq: Every day | ORAL | 1 refills | Status: AC

## 2017-10-20 MED ORDER — LOSARTAN POTASSIUM 25 MG PO TABS
50.0000 mg | ORAL_TABLET | Freq: Once | ORAL | Status: AC
  Administered 2017-10-20: 50 mg via ORAL
  Filled 2017-10-20: qty 2

## 2017-10-20 NOTE — ED Notes (Signed)
Pt reports not taking her BP meds because she ran out, pt also reports not taking lasix because "it doesn't work, I still retain fluid". Pt reports HA, blurred vision, and dizziness x 2 weeks. Pt denies chest pain/SOB. Pt reports being off her medications a few months now.

## 2017-10-20 NOTE — ED Triage Notes (Signed)
2 weeks of HTN. She stopped taking her BP medication several months ago.

## 2017-10-20 NOTE — ED Notes (Signed)
Pt walked to bathroom with steady gate. Pt denies dizziness at this time.

## 2017-10-20 NOTE — ED Provider Notes (Signed)
[facility] EMERGENCY DEPARTMENT Provider Note   CSN: [id number] Arrival date & time: 10/20/17  1832     History   Chief Complaint No chief complaint on file.   HPI Patricia Warner is a 42 y.o. female.  HPI Patient has not been taking her blood pressure medications for at least several months.  She reports that when she ran out, she never went back to get a refill.  She reports she does not really like taking them because they do not make her feel any better.  She does however advised that when she is compliant with medications, her blood pressures typically run in the 120s systolic over 90s diastolic.  Since she stopped taking her medications, she reports her systolic blood pressures have been in the 170s and diastolics at 100.  She reports that she is awakening with headaches fairly frequently over the past several weeks now.  Headaches do go away completely between episodes.  She has no associated neurologic dysfunction.  She does report that her vision is somewhat always blurred now.  She reports that she is supposed to only have to wear her glasses when she is working at the computer or driving at night but now she has to wear her glasses all the time otherwise her vision is blurred.  She does describe getting a dilated funduscopic exam a number of months ago.  She reports her vision is never been as good ever since she had that dilated exam.  She reports headaches will be throbbing and over her whole head.  No nausea, vomiting, weakness numbness tingling or incoordination.  He denies she is any chest pain or shortness of breath.  She reports she chronically has swelling in her ankles.  That has not changed regardless of whether she takes her medications.  No calf pain. Past Medical History:  Diagnosis Date  . Heart murmur   . Hypertension     There are no active problems to display for this patient.   Past Surgical History:  Procedure Laterality Date  . ABDOMINAL  HYSTERECTOMY       OB History   None      Home Medications    Prior to Admission medications   Medication Sig Start Date End Date Taking? Authorizing Provider  benzonatate (TESSALON) 100 MG capsule Take 1 capsule (100 mg total) by mouth every 8 (eight) hours. 06/12/13   Hess, Nada Boozerobyn M, PA-C  fluticasone (FLONASE) 50 MCG/ACT nasal spray Place 2 sprays into both nostrils daily. 06/12/13   Hess, Nada Boozerobyn M, PA-C  furosemide (LASIX) 40 MG tablet Take 40 mg by mouth 2 (two) times daily.    [provider]  hydrochlorothiazide (HYDRODIURIL) 25 MG tablet Take 1 tablet (25 mg total) by mouth daily. 10/20/17   Arby BarrettePfeiffer, Milika Ventress, MD  HYDROcodone-acetaminophen (NORCO/VICODIN) 5-325 MG per tablet Take 1 tablet by mouth every 6 (six) hours as needed for moderate pain. 08/01/14   Elwin MochaWalden, Blair, MD  ibuprofen (ADVIL,MOTRIN) 800 MG tablet Take 1 tablet (800 mg total) by mouth 3 (three) times daily. 08/17/14   Mabe, Latanya MaudlinMartha L, MD  losartan (COZAAR) 50 MG tablet Take 1 tablet (50 mg total) by mouth daily. 10/20/17   Arby BarrettePfeiffer, Versie Fleener, MD  meclizine (ANTIVERT) 12.5 MG tablet Take 12.5 mg by mouth 3 (three) times daily as needed for dizziness.    [provider]  megestrol (MEGACE) 40 MG tablet Take 40 mg by mouth daily.    [provider]  Multiple Vitamin (MULTIVITAMIN)  capsule Take 1 capsule by mouth daily.    [provider]  potassium chloride (K-DUR) 10 MEQ tablet Take 1 tablet (10 mEq total) by mouth daily. 08/03/13   Horton, Mayer Masker, MD    Family History Family History  Problem Relation Age of Onset  . Hypertension Mother   . Hypertension Father     Social History Social History   Tobacco Use  . Smoking status: Never Smoker  . Smokeless tobacco: Never Used  Substance Use Topics  . Alcohol use: No  . Drug use: No     Allergies   Sulfa antibiotics and Latex   Review of Systems Review of Systems 10 Systems reviewed and are negative for acute change except as  noted in the HPI.   Physical Exam Updated Vital Signs BP (!) 171/104   Pulse 80   Temp 98.4 F (36.9 C) (Oral)   Resp 10   Ht 5\' 6"  (1.676 m)   Wt 99.3 kg (219 lb)   SpO2 95%   BMI 35.35 kg/m   Physical Exam  Constitutional: She is oriented to person, place, and time. She appears well-developed and well-nourished.  HENT:  Head: Normocephalic and atraumatic.  Mouth/Throat: Oropharynx is clear and moist.  Eyes: Pupils are equal, round, and reactive to light. EOM are normal.  Funduscopic exam, no flame hemorrhages, optic disc crisp.  Neck: Neck supple.  Cardiovascular: Normal rate, regular rhythm, normal heart sounds and intact distal pulses.  Pulmonary/Chest: Effort normal and breath sounds normal.  Abdominal: Soft. Bowel sounds are normal. She exhibits no distension. There is no tenderness.  Musculoskeletal: Normal range of motion. She exhibits edema. She exhibits no tenderness.  Patient has bilateral ankles that have mild edema.  There also appears to be an element of chronic joint swelling but no effusion.  Skin is in excellent condition.  No erythema or wounds.  Neurological: She is alert and oriented to person, place, and time. She has normal strength. No cranial nerve deficit. She exhibits normal muscle tone. Coordination normal. GCS eye subscore is 4. GCS verbal subscore is 5. GCS motor subscore is 6.  Skin: Skin is warm, dry and intact.  Psychiatric: She has a normal mood and affect.     ED Treatments / Results  Labs (all labs ordered are listed, but only abnormal results are displayed) Labs Reviewed  BASIC METABOLIC PANEL - Abnormal; Notable for the following components:      Result Value   Calcium 8.8 (*)    All other components within normal limits  URINALYSIS, ROUTINE W REFLEX MICROSCOPIC - Abnormal; Notable for the following components:   APPearance CLOUDY (*)    Hgb urine dipstick TRACE (*)    All other components within normal limits  CBC - Abnormal;  Notable for the following components:   WBC 11.0 (*)    Hemoglobin 11.7 (*)    HCT 33.9 (*)    All other components within normal limits  URINALYSIS, MICROSCOPIC (REFLEX) - Abnormal; Notable for the following components:   Bacteria, UA MANY (*)    All other components within normal limits    EKG EKG Interpretation  Date/Time:  Tuesday October 20 2017 21:02:53 EDT Ventricular Rate:  82 PR Interval:    QRS Duration: 105 QT Interval:  370 QTC Calculation: 433 R Axis:   101 Text Interpretation:  Sinus rhythm Right axis deviation Borderline T wave abnormalities no change from previous Confirmed by Arby Barrette [phone]) on 10/20/2017 9:59:34 PM  Radiology No results found.  Procedures Procedures (including critical care time)  Medications Ordered in ED Medications  losartan (COZAAR) tablet 50 mg (50 mg Oral Given 10/20/17 2107)  hydrochlorothiazide (HYDRODIURIL) tablet 25 mg (25 mg Oral Given 10/20/17 2107)     Initial Impression / Assessment and Plan / ED Course  I have reviewed the triage vital signs and the nursing notes.  Pertinent labs & imaging results that were available during my care of the patient were reviewed by me and considered in my medical decision making (see chart for details).      Final Clinical Impressions(s) / ED Diagnoses   Final diagnoses:  Hypertensive urgency  Noncompliance with medication regimen   Patient has known hypertension.  She does not take her medications due to failure to pick up a refill and go to follow-up appointments.  Patient also admits she simply does not like taking them.  She does not specifically have issues with the way they make her feel but does not feel improved when she does take them.  Patient is counseled on long-term complications of poorly managed hypertension.  She voices understanding and awareness of these problems.  At this time, no decompensated findings of hypertension.  Patient does not have a headache at this time.   She has clear mental status with no signs of encephalopathy.  Renal function is normal, no proteinuria, no chest pain and no EKG changes.  Patient will be resumed on losartan and hydrochlorothiazide.  He is counseled on importance of close follow-up with her primary care doctor. ED Discharge Orders        Ordered    losartan (COZAAR) 50 MG tablet  Daily     10/20/17 2202    hydrochlorothiazide (HYDRODIURIL) 25 MG tablet  Daily     10/20/17 2202       Arby Barrette, MD 10/20/17 2211

## 2017-10-20 NOTE — Discharge Instructions (Addendum)
1.  Make a follow-up appointment with your family doctor.  If you do not have a family doctor, use the referral number in your discharge instructions.  You need consistent monitoring of your blood pressure in response to your medications. 2.  Untreated hypertension can lead to heart disease, stroke and kidney failure.

## 2023-01-29 ENCOUNTER — Ambulatory Visit (HOSPITAL_COMMUNITY): Payer: Self-pay | Admitting: Orthopedic Surgery

## 2023-02-05 NOTE — Pre-Procedure Instructions (Signed)
Surgical Instructions   Your procedure is scheduled on Monday, September 30th. Report to [facility] Main Entrance "A" at 05:30 A.M., then check in with the Admitting office. Any questions or running late day of surgery: call [phone]  Questions prior to your surgery date: call [phone], Monday-Friday, 8am-4pm. If you experience any cold or flu symptoms such as cough, fever, chills, shortness of breath, etc. between now and your scheduled surgery, please notify us at the above number.     Remember:  Do not eat after midnight the night before your surgery  You may drink clear liquids until 04:30 AM the morning of your surgery.   Clear liquids allowed are: Water, Non-Citrus Juices (without pulp), Carbonated Beverages, Clear Tea, Black Coffee Only (NO MILK, CREAM OR POWDERED CREAMER of any kind), and Gatorade.    Take these medicines the morning of surgery with A SIP OF WATER  verapamil (CALAN-SR)     One week prior to surgery, STOP taking any Aspirin (unless otherwise instructed by your surgeon) Aleve, Naproxen, Ibuprofen, Motrin, Advil, Goody's, BC's, all herbal medications, fish oil, and non-prescription vitamins.                     Do NOT Smoke (Tobacco/Vaping) for 24 hours prior to your procedure.  If you use a CPAP at night, you may bring your mask/headgear for your overnight stay.   You will be asked to remove any contacts, glasses, piercing's, hearing aid's, dentures/partials prior to surgery. Please bring cases for these items if needed.    Patients discharged the day of surgery will not be allowed to drive home, and someone needs to stay with them for 24 hours.  SURGICAL WAITING ROOM VISITATION Patients may have no more than 2 support people in the waiting area - these visitors may rotate.   Pre-op nurse will coordinate an appropriate time for 1 ADULT support person, who may not rotate, to accompany patient in pre-op.  Children under the age of 39 must have an  adult with them who is not the patient and must remain in the main waiting area with an adult.  If the patient needs to stay at the hospital during part of their recovery, the visitor guidelines for inpatient rooms apply.  Please refer to the [facility] website for the visitor guidelines for any additional information.   If you received a COVID test during your pre-op visit  it is requested that you wear a mask when out in public, stay away from anyone that may not be feeling well and notify your surgeon if you develop symptoms. If you have been in contact with anyone that has tested positive in the last 10 days please notify you surgeon.      Pre-operative 5 CHG Bathing Instructions   You can play a key role in reducing the risk of infection after surgery. Your skin needs to be as free of germs as possible. You can reduce the number of germs on your skin by washing with CHG (chlorhexidine gluconate) soap before surgery. CHG is an antiseptic soap that kills germs and continues to kill germs even after washing.   DO NOT use if you have an allergy to chlorhexidine/CHG or antibacterial soaps. If your skin becomes reddened or irritated, stop using the CHG and notify one of our RNs at [phone].   Please shower with the CHG soap starting 4 days before surgery using the following schedule:     Please keep in mind  the following:  DO NOT shave, including legs and underarms, starting the day of your first shower.   You may shave your face at any point before/day of surgery.  Place clean sheets on your bed the day you start using CHG soap. Use a clean washcloth (not used since being washed) for each shower. DO NOT sleep with pets once you start using the CHG.   CHG Shower Instructions:  Wash your face and private area with normal soap. If you choose to wash your hair, wash first with your normal shampoo.  After you use shampoo/soap, rinse your hair and body thoroughly to remove shampoo/soap  residue.  Turn the water OFF and apply about 3 tablespoons (45 ml) of CHG soap to a CLEAN washcloth.  Apply CHG soap ONLY FROM YOUR NECK DOWN TO YOUR TOES (washing for 3-5 minutes)  DO NOT use CHG soap on face, private areas, open wounds, or sores.  Pay special attention to the area where your surgery is being performed.  If you are having back surgery, having someone wash your back for you may be helpful. Wait 2 minutes after CHG soap is applied, then you may rinse off the CHG soap.  Pat dry with a clean towel  Put on clean clothes/pajamas   If you choose to wear lotion, please use ONLY the CHG-compatible lotions on the back of this paper.   Additional instructions for the day of surgery: DO NOT APPLY any lotions, deodorants, cologne, or perfumes.   Do not bring valuables to the hospital. [facility] is not responsible for any belongings/valuables. Do not wear nail polish, gel polish, artificial nails, or any other type of covering on natural nails (fingers and toes) Do not wear jewelry or makeup Put on clean/comfortable clothes.  Please brush your teeth.  Ask your nurse before applying any prescription medications to the skin.     CHG Compatible Lotions   Aveeno Moisturizing lotion  Cetaphil Moisturizing Cream  Cetaphil Moisturizing Lotion  Clairol Herbal Essence Moisturizing Lotion, Dry Skin  Clairol Herbal Essence Moisturizing Lotion, Extra Dry Skin  Clairol Herbal Essence Moisturizing Lotion, Normal Skin  Curel Age Defying Therapeutic Moisturizing Lotion with Alpha Hydroxy  Curel Extreme Care Body Lotion  Curel Soothing Hands Moisturizing Hand Lotion  Curel Therapeutic Moisturizing Cream, Fragrance-Free  Curel Therapeutic Moisturizing Lotion, Fragrance-Free  Curel Therapeutic Moisturizing Lotion, Original Formula  Eucerin Daily Replenishing Lotion  Eucerin Dry Skin Therapy Plus Alpha Hydroxy Crme  Eucerin Dry Skin Therapy Plus Alpha Hydroxy Lotion  Eucerin Original  Crme  Eucerin Original Lotion  Eucerin Plus Crme Eucerin Plus Lotion  Eucerin TriLipid Replenishing Lotion  Keri Anti-Bacterial Hand Lotion  Keri Deep Conditioning Original Lotion Dry Skin Formula Softly Scented  Keri Deep Conditioning Original Lotion, Fragrance Free Sensitive Skin Formula  Keri Lotion Fast Absorbing Fragrance Free Sensitive Skin Formula  Keri Lotion Fast Absorbing Softly Scented Dry Skin Formula  Keri Original Lotion  Keri Skin Renewal Lotion Keri Silky Smooth Lotion  Keri Silky Smooth Sensitive Skin Lotion  Nivea Body Creamy Conditioning Oil  Nivea Body Extra Enriched Lotion  Nivea Body Original Lotion  Nivea Body Sheer Moisturizing Lotion Nivea Crme  Nivea Skin Firming Lotion  NutraDerm 30 Skin Lotion  NutraDerm Skin Lotion  NutraDerm Therapeutic Skin Cream  NutraDerm Therapeutic Skin Lotion  ProShield Protective Hand Cream  Provon moisturizing lotion  Please read over the following fact sheets that you were given.

## 2023-02-06 ENCOUNTER — Encounter (HOSPITAL_COMMUNITY): Payer: Self-pay

## 2023-02-06 ENCOUNTER — Other Ambulatory Visit: Payer: Self-pay

## 2023-02-06 ENCOUNTER — Encounter (HOSPITAL_COMMUNITY)
Admission: RE | Admit: 2023-02-06 | Discharge: 2023-02-06 | Disposition: A | Discharge status: Home / Self Care | Payer: Worker's Compensation | Source: Ambulatory Visit | Attending: Orthopedic Surgery | Admitting: Orthopedic Surgery

## 2023-02-06 VITALS — BP 114/78 | HR 80 | Temp 98.3°F | Resp 18 | Ht 66.0 in | Wt 225.6 lb

## 2023-02-06 DIAGNOSIS — Z01818 Encounter for other preprocedural examination: Secondary | ICD-10-CM

## 2023-02-06 DIAGNOSIS — I10 Essential (primary) hypertension: Secondary | ICD-10-CM | POA: Insufficient documentation

## 2023-02-06 DIAGNOSIS — Z01810 Encounter for preprocedural cardiovascular examination: Secondary | ICD-10-CM | POA: Diagnosis not present

## 2023-02-06 DIAGNOSIS — R9431 Abnormal electrocardiogram [ECG] [EKG]: Secondary | ICD-10-CM | POA: Diagnosis not present

## 2023-02-06 DIAGNOSIS — Z01812 Encounter for preprocedural laboratory examination: Secondary | ICD-10-CM | POA: Insufficient documentation

## 2023-02-06 LAB — CBC
HCT: 37.5 % (ref 36.0–46.0)
Hemoglobin: 12.5 g/dL (ref 12.0–15.0)
MCH: 27.7 pg (ref 26.0–34.0)
MCHC: 33.3 g/dL (ref 30.0–36.0)
MCV: 83 fL (ref 80.0–100.0)
Platelets: 336 10*3/uL (ref 150–400)
RBC: 4.52 MIL/uL (ref 3.87–5.11)
RDW: 13.7 % (ref 11.5–15.5)
WBC: 10.5 10*3/uL (ref 4.0–10.5)
nRBC: 0 % (ref 0.0–0.2)

## 2023-02-06 LAB — BASIC METABOLIC PANEL
Anion gap: 15 (ref 5–15)
BUN: 11 mg/dL (ref 6–20)
CO2: 29 mmol/L (ref 22–32)
Calcium: 9.3 mg/dL (ref 8.9–10.3)
Chloride: 97 mmol/L — ABNORMAL LOW (ref 98–111)
Creatinine, Ser: 0.87 mg/dL (ref 0.44–1.00)
GFR, Estimated: >60 mL/min (ref >60)
Glucose, Bld: 142 mg/dL — ABNORMAL HIGH (ref 70–99)
Potassium: 2.9 mmol/L — ABNORMAL LOW (ref 3.5–5.1)
Sodium: 141 mmol/L (ref 135–145)

## 2023-02-06 LAB — TYPE AND SCREEN
ABO/RH(D): A POS
Antibody Screen: NEGATIVE

## 2023-02-06 LAB — SURGICAL PCR SCREEN
MRSA, PCR: NEGATIVE
Staphylococcus aureus: NEGATIVE

## 2023-02-06 NOTE — Progress Notes (Signed)
PCP - Dr. Laurann [facility] Cardiologist - denies  PPM/ICD - n/a  Chest x-ray - n/a EKG - 02/06/23 Stress Test - denies ECHO - 11/22/13 Cardiac Cath -denies   Sleep Study - denies CPAP - denies  Blood Thinner Instructions: n/a Aspirin Instructions: n/a  ERAS Protcol -Clear liquids until 0430 DOS PRE-SURGERY Ensure or G2- none ordered  COVID TEST- N/a  Anesthesia review: Yes, abnormal EKG. Documented hx of heart murmur. ECHO done in 2015. Pt states ECHO was normal and no murmur indicated.   Patient denies shortness of breath, fever, cough and chest pain at PAT appointment   All instructions explained to the patient, with a verbal understanding of the material. Patient agrees to go over the instructions while at home for a better understanding. Patient also instructed to self quarantine after being tested for COVID-19. The opportunity to ask questions was provided.

## 2023-02-10 NOTE — Progress Notes (Signed)
Anesthesia Chart Review:  47 yo female with pertinent hx including HTN. She was seen by PCP Sherrie Mustache, PA-C at [facility] on 12/22/22 for proep eval. At that time she was noted to have been out of her antiHTN meds for some time. These were restarted and she was advised she could proceed with surgery once BP consistently below 140/90.  History of echo in 2015 for eval of murmur showed nomal LV systolic function, normal valves.   HTN well controlled at preop visit, BP 114/78.  Preop labs reviewed, mild hypokalemia with potassium 2.9, otherwise unremarkable. Will repeat istat DOS.   EKG 02/06/23: Normal sinus rhythm. Rate 77. Rightward axis. Low voltage QRS  TTE 11/22/2013: - Procedure narrative: Transthoracic echocardiography. Image    quality was suboptimal. The study was technically difficult, as a    result of body habitus.  - Left ventricle: The cavity size was normal. Wall thickness was    increased in a pattern of mild LVH. Systolic function was normal.    The estimated ejection fraction was in the range of 55% to 60%.    Wall motion was normal; there were no regional wall motion    abnormalities. Left ventricular diastolic function parameters    were normal. Mildly elevated filling pressures.     Zannie Cove [facility] Short Stay Center/Anesthesiology Phone [phone] 02/10/2023 10:25 AM

## 2023-02-10 NOTE — Anesthesia Preprocedure Evaluation (Addendum)
Anesthesia Evaluation  Patient identified by MRN, date of birth, ID band Patient awake    Reviewed: Allergy & Precautions, H&P , NPO status , Patient's Chart, lab work & pertinent test results  Airway Mallampati: I  TM Distance: >3 FB Neck ROM: Full    Dental no notable dental hx. (+) Teeth Intact, Dental Advisory Given   Pulmonary neg pulmonary ROS   Pulmonary exam normal breath sounds clear to auscultation       Cardiovascular Exercise Tolerance: Good hypertension, Pt. on medications negative cardio ROS Normal cardiovascular exam Rhythm:Regular Rate:Normal     Neuro/Psych negative neurological ROS  negative psych ROS   GI/Hepatic negative GI ROS, Neg liver ROS,,,  Endo/Other  negative endocrine ROS    Renal/GU negative Renal ROS  negative genitourinary   Musculoskeletal negative musculoskeletal ROS (+)    Abdominal   Peds negative pediatric ROS (+)  Hematology negative hematology ROS (+)   Anesthesia Other Findings   Reproductive/Obstetrics negative OB ROS                             Anesthesia Physical Anesthesia Plan  ASA: 2  Anesthesia Plan: General   Post-op Pain Management: Tylenol PO (pre-op)*, Gabapentin PO (pre-op)* and Ketamine IV*   Induction: Intravenous  PONV Risk Score and Plan: 3 and Ondansetron, Dexamethasone and Treatment may vary due to age or medical condition  Airway Management Planned: Oral ETT  Additional Equipment: None  Intra-op Plan:   Post-operative Plan: Extubation in OR  Informed Consent: I have reviewed the patients History and Physical, chart, labs and discussed the procedure including the risks, benefits and alternatives for the proposed anesthesia with the patient or authorized representative who has indicated his/her understanding and acceptance.       Plan Discussed with: Anesthesiologist and CRNA  Anesthesia Plan Comments: (PAT  note by Antionette Poles, PA-C: 47 yo female with pertinent hx including HTN. She was seen by PCP Sherrie Mustache, PA-C at [facility] on 12/22/22 for proep eval. At that time she was noted to have been out of her antiHTN meds for some time. These were restarted and she was advised she could proceed with surgery once BP consistently below 140/90.  History of echo in 2015 for eval of murmur showed nomal LV systolic function, normal valves.   HTN well controlled at preop visit, BP 114/78.  Preop labs reviewed, mild hypokalemia with potassium 2.9, otherwise unremarkable. Will repeat istat DOS.   EKG 02/06/23: Normal sinus rhythm. Rate 77. Rightward axis. Low voltage QRS  TTE 11/22/2013: - Procedure narrative: Transthoracic echocardiography. Image  quality was suboptimal. The study was technically difficult, as a  result of body habitus.  - Left ventricle: The cavity size was normal. Wall thickness was  increased in a pattern of mild LVH. Systolic function was normal.  The estimated ejection fraction was in the range of 55% to 60%.  Wall motion was normal; there were no regional wall motion  abnormalities. Left ventricular diastolic function parameters  were normal. Mildly elevated filling pressures.    )        Anesthesia Quick Evaluation

## 2023-02-16 ENCOUNTER — Inpatient Hospital Stay (HOSPITAL_COMMUNITY): Payer: 59

## 2023-02-16 ENCOUNTER — Encounter (HOSPITAL_COMMUNITY): Payer: Self-pay | Admitting: Orthopedic Surgery

## 2023-02-16 ENCOUNTER — Other Ambulatory Visit: Payer: Self-pay

## 2023-02-16 ENCOUNTER — Inpatient Hospital Stay (HOSPITAL_COMMUNITY)
Admission: RE | Admit: 2023-02-16 | Discharge: 2023-02-18 | DRG: 402 | Disposition: A | Discharge status: Home / Self Care | Payer: Worker's Compensation | Attending: Orthopedic Surgery | Admitting: Orthopedic Surgery

## 2023-02-16 ENCOUNTER — Inpatient Hospital Stay (HOSPITAL_COMMUNITY): Payer: Worker's Compensation | Admitting: Physician Assistant

## 2023-02-16 ENCOUNTER — Encounter (HOSPITAL_COMMUNITY)
Admission: RE | Disposition: A | Discharge status: Home / Self Care | Payer: Self-pay | Source: Home / Self Care | Attending: Orthopedic Surgery

## 2023-02-16 DIAGNOSIS — Z981 Arthrodesis status: Secondary | ICD-10-CM

## 2023-02-16 DIAGNOSIS — Z91040 Latex allergy status: Secondary | ICD-10-CM

## 2023-02-16 DIAGNOSIS — M5117 Intervertebral disc disorders with radiculopathy, lumbosacral region: Principal | ICD-10-CM | POA: Diagnosis present

## 2023-02-16 DIAGNOSIS — Z882 Allergy status to sulfonamides status: Secondary | ICD-10-CM | POA: Diagnosis not present

## 2023-02-16 DIAGNOSIS — Z91018 Allergy to other foods: Secondary | ICD-10-CM

## 2023-02-16 DIAGNOSIS — I10 Essential (primary) hypertension: Principal | ICD-10-CM | POA: Diagnosis present

## 2023-02-16 DIAGNOSIS — Z79899 Other long term (current) drug therapy: Secondary | ICD-10-CM

## 2023-02-16 DIAGNOSIS — M4807 Spinal stenosis, lumbosacral region: Secondary | ICD-10-CM | POA: Diagnosis present

## 2023-02-16 HISTORY — PX: TRANSFORAMINAL LUMBAR INTERBODY FUSION (TLIF) WITH PEDICLE SCREW FIXATION 1 LEVEL: SHX6141

## 2023-02-16 LAB — POCT I-STAT, CHEM 8
BUN: 8 mg/dL (ref 6–20)
Calcium, Ion: 1.15 mmol/L (ref 1.15–1.40)
Chloride: 101 mmol/L (ref 98–111)
Creatinine, Ser: 0.7 mg/dL (ref 0.44–1.00)
Glucose, Bld: 125 mg/dL — ABNORMAL HIGH (ref 70–99)
HCT: 36 % (ref 36.0–46.0)
Hemoglobin: 12.2 g/dL (ref 12.0–15.0)
Potassium: 2.9 mmol/L — ABNORMAL LOW (ref 3.5–5.1)
Sodium: 138 mmol/L (ref 135–145)
TCO2: 24 mmol/L (ref 22–32)

## 2023-02-16 LAB — ABO/RH: ABO/RH(D): A POS

## 2023-02-16 SURGERY — TRANSFORAMINAL LUMBAR INTERBODY FUSION (TLIF) WITH PEDICLE SCREW FIXATION 1 LEVEL
Anesthesia: General | Site: Spine Lumbar

## 2023-02-16 MED ORDER — ACETAMINOPHEN 650 MG RE SUPP: 650.0000 mg | RECTAL | Status: DC | PRN

## 2023-02-16 MED ORDER — PHENYLEPHRINE 80 MCG/ML (10ML) SYRINGE FOR IV PUSH (FOR BLOOD PRESSURE SUPPORT)
PREFILLED_SYRINGE | INTRAVENOUS | Status: DC | PRN
  Administered 2023-02-16: 80 ug via INTRAVENOUS

## 2023-02-16 MED ORDER — PROPOFOL 1000 MG/100ML IV EMUL
INTRAVENOUS | Status: AC
  Filled 2023-02-16: qty 100

## 2023-02-16 MED ORDER — MIDAZOLAM HCL 2 MG/2ML IJ SOLN
INTRAMUSCULAR | Status: DC | PRN
  Administered 2023-02-16: 2 mg via INTRAVENOUS

## 2023-02-16 MED ORDER — MEPERIDINE HCL 25 MG/ML IJ SOLN: 6.2500 mg | INTRAMUSCULAR | Status: DC | PRN

## 2023-02-16 MED ORDER — BUPIVACAINE-EPINEPHRINE (PF) 0.25% -1:200000 IJ SOLN
INTRAMUSCULAR | Status: AC
  Filled 2023-02-16: qty 30

## 2023-02-16 MED ORDER — HYDROMORPHONE HCL 1 MG/ML IJ SOLN: 1.0000 mg | INTRAMUSCULAR | Status: AC | PRN

## 2023-02-16 MED ORDER — ONDANSETRON HCL 4 MG/2ML IJ SOLN
INTRAMUSCULAR | Status: AC
  Filled 2023-02-16: qty 2

## 2023-02-16 MED ORDER — METHOCARBAMOL 1000 MG/10ML IJ SOLN: 500.0000 mg | Freq: Four times a day (QID) | INTRAVENOUS | Status: DC | PRN

## 2023-02-16 MED ORDER — ORAL CARE MOUTH RINSE: 15.0000 mL | Freq: Once | OROMUCOSAL | Status: AC

## 2023-02-16 MED ORDER — SODIUM CHLORIDE 0.9 % IV SOLN
250.0000 mL | INTRAVENOUS | Status: DC
  Administered 2023-02-16: 250 mL via INTRAVENOUS

## 2023-02-16 MED ORDER — OXYCODONE HCL 5 MG PO TABS
5.0000 mg | ORAL_TABLET | ORAL | Status: DC | PRN
  Filled 2023-02-16: qty 1

## 2023-02-16 MED ORDER — SODIUM CHLORIDE 0.9% FLUSH: 3.0000 mL | INTRAVENOUS | Status: DC | PRN

## 2023-02-16 MED ORDER — ACETAMINOPHEN 160 MG/5ML PO SOLN: 325.0000 mg | ORAL | Status: DC | PRN

## 2023-02-16 MED ORDER — SUCCINYLCHOLINE CHLORIDE 200 MG/10ML IV SOSY
PREFILLED_SYRINGE | INTRAVENOUS | Status: DC | PRN
  Administered 2023-02-16: 140 mg via INTRAVENOUS

## 2023-02-16 MED ORDER — PROPOFOL 10 MG/ML IV BOLUS
INTRAVENOUS | Status: AC
  Filled 2023-02-16: qty 20

## 2023-02-16 MED ORDER — MAGNESIUM CITRATE PO SOLN
1.0000 | Freq: Once | ORAL | Status: AC | PRN
  Administered 2023-02-17: 1 via ORAL
  Filled 2023-02-16: qty 296

## 2023-02-16 MED ORDER — FENTANYL CITRATE (PF) 100 MCG/2ML IJ SOLN: 25.0000 ug | INTRAMUSCULAR | Status: DC | PRN

## 2023-02-16 MED ORDER — ONDANSETRON HCL 4 MG/2ML IJ SOLN
INTRAMUSCULAR | Status: DC | PRN
  Administered 2023-02-16: 4 mg via INTRAVENOUS

## 2023-02-16 MED ORDER — ROCURONIUM BROMIDE 10 MG/ML (PF) SYRINGE
PREFILLED_SYRINGE | INTRAVENOUS | Status: DC | PRN
  Administered 2023-02-16: 10 mg via INTRAVENOUS

## 2023-02-16 MED ORDER — THROMBIN 20000 UNITS EX SOLR
CUTANEOUS | Status: AC
  Filled 2023-02-16: qty 20000

## 2023-02-16 MED ORDER — THROMBIN 20000 UNITS EX SOLR
CUTANEOUS | Status: DC | PRN
  Administered 2023-02-16: 20 mL via TOPICAL

## 2023-02-16 MED ORDER — KETAMINE HCL 50 MG/5ML IJ SOSY
PREFILLED_SYRINGE | INTRAMUSCULAR | Status: AC
  Filled 2023-02-16: qty 10

## 2023-02-16 MED ORDER — FENTANYL CITRATE (PF) 250 MCG/5ML IJ SOLN
INTRAMUSCULAR | Status: AC
  Filled 2023-02-16: qty 5

## 2023-02-16 MED ORDER — METHOCARBAMOL 500 MG PO TABS
500.0000 mg | ORAL_TABLET | Freq: Four times a day (QID) | ORAL | Status: DC | PRN
  Administered 2023-02-16 – 2023-02-18 (×7): 500 mg via ORAL
  Filled 2023-02-16 (×7): qty 1

## 2023-02-16 MED ORDER — 0.9 % SODIUM CHLORIDE (POUR BTL) OPTIME
TOPICAL | Status: DC | PRN
  Administered 2023-02-16: 1000 mL

## 2023-02-16 MED ORDER — FUROSEMIDE 20 MG PO TABS
20.0000 mg | ORAL_TABLET | Freq: Two times a day (BID) | ORAL | Status: DC
  Administered 2023-02-16 – 2023-02-18 (×4): 20 mg via ORAL
  Filled 2023-02-16 (×4): qty 1

## 2023-02-16 MED ORDER — DEXAMETHASONE SODIUM PHOSPHATE 4 MG/ML IJ SOLN
4.0000 mg | Freq: Four times a day (QID) | INTRAMUSCULAR | Status: DC
  Administered 2023-02-16: 4 mg via INTRAVENOUS
  Filled 2023-02-16: qty 1

## 2023-02-16 MED ORDER — DEXAMETHASONE SODIUM PHOSPHATE 10 MG/ML IJ SOLN
INTRAMUSCULAR | Status: DC | PRN
Start: 2023-02-16 — End: 2023-02-16
  Administered 2023-02-16: 10 mg via INTRAVENOUS

## 2023-02-16 MED ORDER — POLYETHYLENE GLYCOL 3350 17 G PO PACK: 17.0000 g | PACK | Freq: Every day | ORAL | Status: DC | PRN

## 2023-02-16 MED ORDER — ACETAMINOPHEN 325 MG PO TABS: 325.0000 mg | ORAL_TABLET | ORAL | Status: DC | PRN

## 2023-02-16 MED ORDER — LACTATED RINGERS IV SOLN: INTRAVENOUS | Status: DC

## 2023-02-16 MED ORDER — VERAPAMIL HCL ER 120 MG PO TBCR
120.0000 mg | EXTENDED_RELEASE_TABLET | Freq: Every day | ORAL | Status: DC
  Administered 2023-02-17 – 2023-02-18 (×2): 120 mg via ORAL
  Filled 2023-02-16 (×2): qty 1

## 2023-02-16 MED ORDER — ACETAMINOPHEN 500 MG PO TABS
1000.0000 mg | ORAL_TABLET | Freq: Once | ORAL | Status: AC
  Administered 2023-02-16: 1000 mg via ORAL
  Filled 2023-02-16: qty 2

## 2023-02-16 MED ORDER — KETAMINE HCL 10 MG/ML IJ SOLN
INTRAMUSCULAR | Status: DC | PRN
Start: 2023-02-16 — End: 2023-02-16
  Administered 2023-02-16: 25 mg via INTRAVENOUS

## 2023-02-16 MED ORDER — FENTANYL CITRATE (PF) 100 MCG/2ML IJ SOLN
INTRAMUSCULAR | Status: AC
  Administered 2023-02-16: 25 ug via INTRAVENOUS
  Filled 2023-02-16: qty 2

## 2023-02-16 MED ORDER — TRANEXAMIC ACID-NACL 1000-0.7 MG/100ML-% IV SOLN
1000.0000 mg | INTRAVENOUS | Status: AC
  Administered 2023-02-16: 1000 mg via INTRAVENOUS
  Filled 2023-02-16: qty 100

## 2023-02-16 MED ORDER — ONDANSETRON HCL 4 MG/2ML IJ SOLN
4.0000 mg | Freq: Four times a day (QID) | INTRAMUSCULAR | Status: DC | PRN
  Administered 2023-02-16: 4 mg via INTRAVENOUS
  Filled 2023-02-16: qty 2

## 2023-02-16 MED ORDER — ONDANSETRON HCL 4 MG/2ML IJ SOLN
4.0000 mg | Freq: Once | INTRAMUSCULAR | Status: AC | PRN
  Administered 2023-02-16: 4 mg via INTRAVENOUS

## 2023-02-16 MED ORDER — ACETAMINOPHEN 325 MG PO TABS
650.0000 mg | ORAL_TABLET | ORAL | Status: DC | PRN
  Administered 2023-02-16 – 2023-02-17 (×2): 650 mg via ORAL
  Filled 2023-02-16 (×2): qty 2

## 2023-02-16 MED ORDER — GABAPENTIN 300 MG PO CAPS
300.0000 mg | ORAL_CAPSULE | ORAL | Status: AC
  Administered 2023-02-16: 300 mg via ORAL
  Filled 2023-02-16: qty 1

## 2023-02-16 MED ORDER — FENTANYL CITRATE (PF) 250 MCG/5ML IJ SOLN
INTRAMUSCULAR | Status: DC | PRN
  Administered 2023-02-16: 100 ug via INTRAVENOUS
  Administered 2023-02-16: 150 ug via INTRAVENOUS

## 2023-02-16 MED ORDER — ONDANSETRON HCL 4 MG PO TABS: 4.0000 mg | ORAL_TABLET | Freq: Four times a day (QID) | ORAL | Status: DC | PRN

## 2023-02-16 MED ORDER — MIDAZOLAM HCL 2 MG/2ML IJ SOLN
INTRAMUSCULAR | Status: AC
  Filled 2023-02-16: qty 2

## 2023-02-16 MED ORDER — PHENYLEPHRINE HCL-NACL 20-0.9 MG/250ML-% IV SOLN
INTRAVENOUS | Status: DC | PRN
Start: 2023-02-16 — End: 2023-02-16
  Administered 2023-02-16: 40 ug/min via INTRAVENOUS

## 2023-02-16 MED ORDER — CEFAZOLIN SODIUM-DEXTROSE 2-4 GM/100ML-% IV SOLN
2.0000 g | INTRAVENOUS | Status: DC
  Filled 2023-02-16: qty 100

## 2023-02-16 MED ORDER — CEFAZOLIN SODIUM-DEXTROSE 2-3 GM-%(50ML) IV SOLR
INTRAVENOUS | Status: DC | PRN
  Administered 2023-02-16: 2 g via INTRAVENOUS

## 2023-02-16 MED ORDER — BUPIVACAINE-EPINEPHRINE 0.25% -1:200000 IJ SOLN
INTRAMUSCULAR | Status: DC | PRN
  Administered 2023-02-16: 30 mL

## 2023-02-16 MED ORDER — SODIUM CHLORIDE 0.9% FLUSH: 3.0000 mL | Freq: Two times a day (BID) | INTRAVENOUS | Status: DC

## 2023-02-16 MED ORDER — HYDROXYZINE HCL 50 MG/ML IM SOLN: 50.0000 mg | Freq: Four times a day (QID) | INTRAMUSCULAR | Status: DC | PRN

## 2023-02-16 MED ORDER — AMISULPRIDE (ANTIEMETIC) 5 MG/2ML IV SOLN
INTRAVENOUS | Status: AC
  Administered 2023-02-16: 10 mg
  Filled 2023-02-16: qty 4

## 2023-02-16 MED ORDER — OXYCODONE HCL 5 MG PO TABS: 5.0000 mg | ORAL_TABLET | Freq: Once | ORAL | Status: DC | PRN

## 2023-02-16 MED ORDER — OXYCODONE HCL 5 MG PO TABS
10.0000 mg | ORAL_TABLET | ORAL | Status: DC | PRN
  Administered 2023-02-16 – 2023-02-17 (×6): 10 mg via ORAL
  Administered 2023-02-17: 5 mg via ORAL
  Administered 2023-02-17 – 2023-02-18 (×5): 10 mg via ORAL
  Filled 2023-02-16 (×12): qty 2

## 2023-02-16 MED ORDER — DROPERIDOL 2.5 MG/ML IJ SOLN
INTRAMUSCULAR | Status: AC
  Administered 2023-02-16: 0.625 mg
  Filled 2023-02-16: qty 2

## 2023-02-16 MED ORDER — SENNOSIDES-DOCUSATE SODIUM 8.6-50 MG PO TABS
1.0000 | ORAL_TABLET | Freq: Two times a day (BID) | ORAL | Status: DC
  Administered 2023-02-16 – 2023-02-18 (×4): 1 via ORAL
  Filled 2023-02-16 (×4): qty 1

## 2023-02-16 MED ORDER — MENTHOL 3 MG MT LOZG: 1.0000 | LOZENGE | OROMUCOSAL | Status: DC | PRN

## 2023-02-16 MED ORDER — PROPOFOL 500 MG/50ML IV EMUL
INTRAVENOUS | Status: DC | PRN
Start: 2023-02-16 — End: 2023-02-16
  Administered 2023-02-16: 100 ug/kg/min via INTRAVENOUS

## 2023-02-16 MED ORDER — CHLORHEXIDINE GLUCONATE 0.12 % MT SOLN
15.0000 mL | Freq: Once | OROMUCOSAL | Status: AC
  Administered 2023-02-16: 15 mL via OROMUCOSAL
  Filled 2023-02-16: qty 15

## 2023-02-16 MED ORDER — LIDOCAINE 2% (20 MG/ML) 5 ML SYRINGE
INTRAMUSCULAR | Status: DC | PRN
  Administered 2023-02-16: 100 mg via INTRAVENOUS

## 2023-02-16 MED ORDER — OXYCODONE HCL 5 MG/5ML PO SOLN: 5.0000 mg | Freq: Once | ORAL | Status: DC | PRN

## 2023-02-16 MED ORDER — PROPOFOL 10 MG/ML IV BOLUS
INTRAVENOUS | Status: DC | PRN
  Administered 2023-02-16: 200 mg via INTRAVENOUS

## 2023-02-16 MED ORDER — HYDROCHLOROTHIAZIDE 25 MG PO TABS
25.0000 mg | ORAL_TABLET | Freq: Every day | ORAL | Status: DC
  Administered 2023-02-16 – 2023-02-18 (×3): 25 mg via ORAL
  Filled 2023-02-16 (×3): qty 1

## 2023-02-16 MED ORDER — CEFAZOLIN SODIUM-DEXTROSE 1-4 GM/50ML-% IV SOLN
1.0000 g | Freq: Three times a day (TID) | INTRAVENOUS | Status: AC
  Administered 2023-02-16 (×2): 1 g via INTRAVENOUS
  Filled 2023-02-16 (×2): qty 50

## 2023-02-16 MED ORDER — PHENOL 1.4 % MT LIQD: 1.0000 | OROMUCOSAL | Status: DC | PRN

## 2023-02-16 MED ORDER — DEXAMETHASONE 4 MG PO TABS
4.0000 mg | ORAL_TABLET | Freq: Four times a day (QID) | ORAL | Status: DC
  Administered 2023-02-16 – 2023-02-18 (×7): 4 mg via ORAL
  Filled 2023-02-16 (×7): qty 1

## 2023-02-16 SURGICAL SUPPLY — 75 items
AGENT HMST KT MTR STRL THRMB (HEMOSTASIS) ×1
BAG COUNTER SPONGE SURGICOUNT (BAG) ×1 IMPLANT
BAG SPNG CNTER NS LX DISP (BAG) ×1
BLADE BONE MILL FINE (MISCELLANEOUS) ×1
BLADE CLIPPER SURG (BLADE) IMPLANT
BLADE MILL BN FN STRL DISP (MISCELLANEOUS) IMPLANT
BUR EGG ELITE 4.0 (BURR) ×1 IMPLANT
BUR MATCHSTICK NEURO 3.0 LAGG (BURR) ×1 IMPLANT
CAGE MOD EX PL 7X9X24 17D (Cage) IMPLANT
CANISTER SUCT 3000ML PPV (MISCELLANEOUS) ×1 IMPLANT
CAP RELINE MOD TULIP RMM (Cap) IMPLANT
CLIP NEUROVISION LG (NEUROSURGERY SUPPLIES) IMPLANT
CLSR STERI-STRIP ANTIMIC 1/2X4 (GAUZE/BANDAGES/DRESSINGS) ×1 IMPLANT
COVER SURGICAL LIGHT HANDLE (MISCELLANEOUS) ×1 IMPLANT
DRAIN CHANNEL 15F RND FF W/TCR (WOUND CARE) IMPLANT
DRAPE C-ARM 42X72 X-RAY (DRAPES) ×1 IMPLANT
DRAPE C-ARMOR (DRAPES) ×1 IMPLANT
DRAPE POUCH INSTRU U-SHP 10X18 (DRAPES) IMPLANT
DRAPE SURG 17X23 STRL (DRAPES) ×1 IMPLANT
DRAPE U-SHAPE 47X51 STRL (DRAPES) ×1 IMPLANT
DRSG OPSITE POSTOP 4X8 (GAUZE/BANDAGES/DRESSINGS) ×1 IMPLANT
DURAPREP 26ML APPLICATOR (WOUND CARE) ×1 IMPLANT
ELECT BLADE 6.5 EXT (BLADE) ×1 IMPLANT
ELECT PENCIL ROCKER SW 15FT (MISCELLANEOUS) ×1 IMPLANT
ELECT REM PT RETURN 9FT ADLT (ELECTROSURGICAL) ×1
ELECTRODE REM PT RTRN 9FT ADLT (ELECTROSURGICAL) ×1 IMPLANT
GLOVE BIOGEL PI IND STRL 8.5 (GLOVE) ×1 IMPLANT
GLOVE SS BIOGEL STRL SZ 8.5 (GLOVE) ×1 IMPLANT
GOWN STRL REUS W/TWL 2XL LVL3 (GOWN DISPOSABLE) ×1 IMPLANT
INTRAOP MONITOR FEE IMPULS NCS (MISCELLANEOUS) ×1
KIT BASIN OR (CUSTOM PROCEDURE TRAY) ×1 IMPLANT
KIT POSITION SURG JACKSON T1 (MISCELLANEOUS) IMPLANT
KIT TURNOVER KIT B (KITS) ×1 IMPLANT
MODULE EMG NDL SSEP NVM5 (NEUROSURGERY SUPPLIES) IMPLANT
MODULE EMG NEEDLE SSEP NVM5 (NEUROSURGERY SUPPLIES) ×1 IMPLANT
MODULE NVM5 NEXT GEN EMG (NEUROSURGERY SUPPLIES) IMPLANT
NDL 22X1.5 STRL (OR ONLY) (MISCELLANEOUS) IMPLANT
NDL SPNL 18GX3.5 QUINCKE PK (NEEDLE) ×1 IMPLANT
NEEDLE 22X1.5 STRL (OR ONLY) (MISCELLANEOUS) IMPLANT
NEEDLE SPNL 18GX3.5 QUINCKE PK (NEEDLE) ×1 IMPLANT
NS IRRIG 1000ML POUR BTL (IV SOLUTION) ×1 IMPLANT
PACK LAMINECTOMY ORTHO (CUSTOM PROCEDURE TRAY) ×1 IMPLANT
PACK UNIVERSAL I (CUSTOM PROCEDURE TRAY) ×1 IMPLANT
PAD ARMBOARD 7.5X6 YLW CONV (MISCELLANEOUS) ×2 IMPLANT
PATTIES SURGICAL .5 X.5 (GAUZE/BANDAGES/DRESSINGS) IMPLANT
PATTIES SURGICAL .5 X1 (DISPOSABLE) ×1 IMPLANT
POSITIONER HEAD PRONE TRACH (MISCELLANEOUS) ×1 IMPLANT
PROBE BALL TIP NVM5 SNG USE (NEUROSURGERY SUPPLIES) IMPLANT
PUTTY DBM INSTAFILL CART 5CC (Putty) IMPLANT
ROD RELINE 5.0X45MM (Rod) IMPLANT
ROD RELINE COCR LORD 5X40MM (Rod) IMPLANT
SCREW LOCK RSS 4.5/5.0MM (Screw) IMPLANT
SCREW SHANK RELINE MOD 5.5X35 (Screw) IMPLANT
SPONGE SURGIFOAM ABS GEL 100 (HEMOSTASIS) ×1 IMPLANT
SPONGE T-LAP 4X18 ~~LOC~~+RFID (SPONGE) ×3 IMPLANT
SURGIFLO W/THROMBIN 8M KIT (HEMOSTASIS) ×1 IMPLANT
SUT BONE WAX W31G (SUTURE) ×1 IMPLANT
SUT MNCRL AB 3-0 PS2 27 (SUTURE) ×2 IMPLANT
SUT MNCRL+ AB 3-0 CT1 36 (SUTURE) ×1 IMPLANT
SUT STRATAFIX 1PDS 45CM VIOLET (SUTURE) IMPLANT
SUT VIC AB 0 CT1 27 (SUTURE)
SUT VIC AB 0 CT1 27XBRD ANTBC (SUTURE) ×1 IMPLANT
SUT VIC AB 1 CT1 18XCR BRD 8 (SUTURE) ×1 IMPLANT
SUT VIC AB 1 CT1 8-18 (SUTURE)
SUT VIC AB 2-0 CT1 18 (SUTURE) ×1 IMPLANT
SYR BULB IRRIG 60ML STRL (SYRINGE) ×1 IMPLANT
SYR CONTROL 10ML LL (SYRINGE) ×1 IMPLANT
TIP CONICAL INSTAFILL (ORTHOPEDIC DISPOSABLE SUPPLIES) IMPLANT
TOWEL GREEN STERILE (TOWEL DISPOSABLE) ×1 IMPLANT
TOWEL GREEN STERILE FF (TOWEL DISPOSABLE) ×1 IMPLANT
TRAY FOL W/BAG SLVR 16FR STRL (SET/KITS/TRAYS/PACK) IMPLANT
TRAY FOLEY MTR SLVR 16FR STAT (SET/KITS/TRAYS/PACK) ×1 IMPLANT
TRAY FOLEY W/BAG SLVR 16FR LF (SET/KITS/TRAYS/PACK) ×1
WATER STERILE IRR 1000ML POUR (IV SOLUTION) ×1 IMPLANT
YANKAUER SUCT BULB TIP NO VENT (SUCTIONS) ×1 IMPLANT

## 2023-02-16 NOTE — Anesthesia Postprocedure Evaluation (Signed)
Anesthesia Post Note  Patient: Patricia Warner  Procedure(s) Performed: TRANSFORAMINAL LUMBAR INTERBODY FUSION LUMBAR FIVE TO SACRAL ONE (Spine Lumbar)     Anesthesia Type: General Anesthetic complications: no   No notable events documented.  Last Vitals:  Vitals:   02/16/23 1230 02/16/23 1245  BP: (!) 149/91 127/87  Pulse: 87 68  Resp: (!) 22 11  Temp:    SpO2: 96% (!) 89%    Last Pain:  Vitals:   02/16/23 1230  TempSrc:   PainSc: 7                  Etoy Mcdonnell

## 2023-02-16 NOTE — Progress Notes (Signed)
Dr. Tacy Dura made aware that patient potassium level from today's ISTAT is 2.9. Ok per Dr. Tacy Dura. No new orders received.

## 2023-02-16 NOTE — Op Note (Signed)
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  7 x 9 x 24.  Expanded to an anterior height of 12 mm and posterior height of 9 mm.  5.5 x 35 mm cortical pedicle screws.  40 mm rod on the right and a 45 mm length rod on the left.  Graft: Autograft from decompression and DBX mix  Neuromonitoring: No adverse free running EMG or SSEP activity throughout the case.  All 4 pedicle screws were directly stimulated.  Left: L5 positive at 30 mA.  S1: 19 mA.  Right side: L5 positive at 35 mA.  S1: Positive at 14 mA  BRIEF HISTORY: Patricia Warner is a 47 y.o. female who presented my office with severe back buttock and radicular leg pain.  Imaging confirmed severe foraminal stenosis with nerve compression.  Patient had temporary improvement with a nerve block.  Despite appropriate conservative management her quality of life is continued to deteriorate and so she elected to move forward with surgery.  All appropriate risks, benefits, and alternatives to surgery were discussed with the patient and consent was obtained.  PROCEDURE DETAILS: Patient was brought into the operating room and was properly positioned on the operating room table.  After induction with general anesthesia the patient was endotracheally intubated.  A timeout was taken to confirm all important data: including patient, procedure, and the level. Teds, SCD's were applied.   Foley and neuromonitoring needles were placed.  Patient was turned prone onto the [facility] spine frame.  Bony prominences were wall well-padded and the back was prepped and  draped in a standard fashion.  Using fluoroscopy I marked out my incision site spanning from the L5 pedicle to the S1 pedicle.  I infiltrated the incision site with quarter percent Marcaine with epinephrine.  Midline incision was made and sharp dissection was carried out down to the deep fascia.  The deep fascia was sharply incised and I stripped the paraspinal muscles bilaterally to expose the L5 and S1 lamina and the L4-5 and L5-S1 facet complex.  Once the exposure was complete bilaterally I then used my Penfield 4 to mark out and confirm the L5 lamina.  Once this was done I used my electrocautery to remove the entire facet capsule at L5-S1 bilaterally.  High-speed bur was placed on the superior medial aspect of the L5 pedicle as seen on the AP view.  I breach the cortex and then used the pedicle awl to advance through the pedicle aiming towards the superior lateral corner.  Once I was in the lateral third of the pedicle I switched to the lateral view to confirm that it was just at the posterior wall the vertebral body.  Once I confirmed my trajectory and position I advanced into the vertebral body.  I then removed the awl and palpated the hole with a ball-tipped feeler.  Once I confirmed that it was intact I then tapped and then rechecked with a ball-tipped feeler.  The 5.5 x 35 mm screw was then placed.  It had excellent purchase.  Using this exact same technique I placed the same size screw on the contralateral side  at L5.  I repositioned the retractor to expose L5-S1 facet complex.  I then placed a high-speed bur on the medial portion of the pedicle and breach the cortex.  I again used the pedicle awl at this time starting at the medial aspect and aiming towards the lateral aspect of the mid part of the pedicle.  I confirmed trajectory and position in both planes.  I then sounded the bony canal that I created with the awl and confirmed it was intact.  I then used the tap and again repalpated the hole.  The  screws were placed at S1 and both had excellent purchase.  At this point I was quite pleased with the x-rays demonstrating the pedicle screw fixation L5 and S1.  The majority of the L5 spinous process was removed and then I performed a generous laminotomy of L5 on the left side with a Kerrison rongeur.  An osteotome was used to resect the inferior L5 facet in its entirety.  I then developed a plane through the ligamentum flavum with my Penfield 4 to create a plane between the thecal sac and the ligamentum flavum.  Using 3 mm Kerrison rongeurs I removed all of the ligamentum flavum.  I then was able to create a plane in the lateral recess where I encountered a very large epidural vein.  I performed a medial facetectomy with a Kerrison rongeur and then isolated the epidural vein and coagulated with bipolar electrocautery.  I then continued my decompression by removing the superior portion of the S1 facet to expose the L5 nerve root in the foramen.  The pars was resected and I adequately decompress the L5 nerve root.  I then identified the S1 nerve root and traced it into the foramen and performed a foraminotomy at S1.  I now had exposed to the medial border of the S1 pedicle which I could clearly visualize.  I could also see the posterior lateral aspect of the annulus.  The L5 nerve root was visualized as well.  At this point is quite pleased with the decompression.  I then placed the neuro patties superiorly and inferiorly to aid in retraction and protection of the nerves.  An annulotomy was then performed with a 15 blade scalpel.  Using curettes and pituitary rongeurs I removed all of the disc material.  I made sure that I was working across the midline to the contralateral side.  Once I was pleased with the overall discectomy I placed the trialing device and and on the AP view it was across the midline.  Overall is quite pleased with the final position.  I continued working across the midline and removing disc  material.  Once I was satisfied with the overall discectomy I irrigated the wound copiously with normal saline.  I then placed bone graft along the anterior surface of the annulus and in the left lateral corner of the disc space.  I impacted this so that it would not hinder placement of the cage.  The implant was obtained and equal sac was retracted.  I then placed the implant and malleted it across.  I made sure to drop my hand to ensure that the implant would traverse across the midportion of the vertebral body.  Once it was countersunk I took an AP view and confirmed that the cage was properly positioned.  I then expanded the anterior and posterior portions of the cage until they provided excellent overall fixation.  I then packed bone graft that I  had harvested from the decompression lateral and slightly anterior to the cage.  I then inserted DBX mix directly into the cage.    At this point I turned my attention to bleeding the pedicle screw construct.  Using a high-speed bur I decorticated the L5-S1 facet on the right side as well as the L5 transverse process and a portion of the sacral ala.  I then packed the posterior lateral gutter with the remainder of the autograft bone.  I then applied the polyaxial heads and the rod.  The locking caps were then secured and tightened according to manufacture standards.  The remaining portion of DBX mix was then placed in the posterior lateral gutter to aid in the posterior lateral arthrodesis.  On the left-hand side I applied the polyaxial heads and the rod and secured in place.  I then placed Floseal to aid in hemostasis.  The wound was then irrigated copiously with normal saline.  Once I confirmed hemostasis I then closed thrombin-soaked Gelfoam patty over the laminotomy site and removed the retractors and took my final x-rays.  Imaging was satisfactory with no hardware issues.  I then closed the deep fascia with a #1 strata fix and then superficial with interrupted  2-0 Vicryl sutures.  The skin was reapproximated with 3-0 Monocryl.  Steri-Strips and dry dressings were applied and the patient was ultimately extubated and transferred the PACU without incident.  The end of the case all needle sponge counts were correct.  There were no adverse intraoperative events.  Venita Lick, MD 02/16/2023 11:43 AM

## 2023-02-16 NOTE — Transfer of Care (Signed)
Immediate Anesthesia Transfer of Care Note  Patient: Anjani [place]  Procedure(s) Performed: TRANSFORAMINAL LUMBAR INTERBODY FUSION LUMBAR FIVE TO SACRAL ONE (Spine Lumbar)  Patient Location: PACU  Anesthesia Type:General  Level of Consciousness: awake  Airway & Oxygen Therapy: Patient Spontanous Breathing and Patient connected to nasal cannula oxygen  Post-op Assessment: Report given to RN  Post vital signs: Reviewed  Last Vitals:  Vitals Value Taken Time  BP 133/84 02/16/23 1300  Temp 36.5 C 02/16/23 1200  Pulse 64 02/16/23 1300  Resp 9 02/16/23 1300  SpO2 96 % 02/16/23 1300  Vitals shown include unfiled device data.  Last Pain:  Vitals:   02/16/23 1230  TempSrc:   PainSc: 7       Patients Stated Pain Goal: 3 (02/16/23 1230)  Complications: No notable events documented.

## 2023-02-16 NOTE — H&P (Signed)
History:  Patricia Warner is a very pleasant 47 year old woman with significant back buttock and radicular left leg pain. Despite appropriate conservative management her quality of life is continued to deteriorate. As a result we have elected to move forward with L5-S1 TLIF.  Past Medical History:  Diagnosis Date   Heart murmur    Hypertension     Allergies  Allergen Reactions   Coconut Fatty Acid Swelling   Pineapple Swelling   Latex Rash   Sulfa Antibiotics Hives and Rash    No current facility-administered medications on file prior to encounter.   Current Outpatient Medications on File Prior to Encounter  Medication Sig Dispense Refill   furosemide (LASIX) 20 MG tablet Take 20 mg by mouth 2 (two) times daily.     hydrochlorothiazide (HYDRODIURIL) 25 MG tablet Take 1 tablet (25 mg total) by mouth daily. 30 tablet 1   verapamil (CALAN-SR) 120 MG CR tablet Take 120 mg by mouth daily.     ibuprofen (ADVIL,MOTRIN) 800 MG tablet Take 1 tablet (800 mg total) by mouth 3 (three) times daily. (Patient taking differently: Take 800 mg by mouth every 8 (eight) hours as needed for moderate pain.) 21 tablet 0    Physical Exam: Vitals:   02/16/23 0542  BP: (!) 152/95  Pulse: 85  Resp: 18  Temp: 98.7 F (37.1 C)  SpO2: 97%   Body mass index is 36.64 kg/m. Clinical exam: Patricia Warner is a pleasant individual, who appears younger than their stated age.  She is alert and orientated 3.  No shortness of breath, chest pain.  Abdomen is soft and non-tender, negative loss of bowel and bladder control, no rebound tenderness.  Negative: skin lesions abrasions contusions  Peripheral pulses: 2+ peripheral pulses bilaterally. LE compartments are: Soft and nontender.  Gait pattern: normal  Assistive devices: None  Neuro: 5/5 motor strength in the upper extremity bilaterally. Intermittent numbness and dysesthesias into the left thigh to the level of the knee. Negative nerve root tension signs  in the lower extremity, negative Babinski test, and no clonus. Symmetrical 2+ deep tendon reflexes.  Musculoskeletal: Significant back pain with palpation in the midline radiating out over into the paraspinal region. No SI joint pain.  Imaging: X-rays of the lumbar spine demonstrate some mild degenerative changes at L5-S1 with loss of normal disc height but no spondylolisthesis or scoliosis  Lumbar MRI: completed on 08/19/2022. Minimal degenerative changes at L4-5. More advanced degenerative disc disease L5-S1 with lateral recess stenosis, and moderate foraminal stenosis primarily affecting the exiting L5 nerve root. Posterior hard disc osteophyte secondary to the degenerative disc disease.  Transforaminal epidural steroid injection provided 2 days of significant improvement. Current pain level: 8/10.  A/P: Patricia Warner is a very pleasant 47 year old woman has had progressive back buttock and radicular leg pain left worse than the right for some time now. Despite injection therapy, physical therapy, and medications her overall quality of life is continued to deteriorate. She recently had a left-sided transforaminal epidural steroid injection at L5-S1 which provided 2 days of near complete relief. Unfortunately this was only temporary and her pain has returned.  In order to address the lateral recess and foraminal stenosis causing nerve compression she would require a Gill decompression. In order to address the discogenic back pain she would require a complete discectomy with fusion. Given this I think the best surgical option is a TLIF at L5-S1. I have gone over the surgical procedure in great detail and I provided her with  some literature. We have explained the risks and benefits and the goal of surgery. Primary goal of surgery is to reduce not limiting her pain and improve her quality of life.  Risks and benefits of spinal fusion: Infection, bleeding, death, stroke, paralysis, ongoing or worse pain, need  for additional surgery, nonunion, leak of spinal fluid, adjacent segment degeneration requiring additional fusion surgery, Injury to abdominal vessels that can require anterior surgery to stop bleeding. Malposition of the cage and/or pedicle screws that could require additional surgery. Loss of bowel and bladder control. Postoperative hematoma causing neurologic compression that could require urgent or emergent re-operation.

## 2023-02-16 NOTE — Brief Op Note (Signed)
02/16/2023  11:58 AM  PATIENT:  Patricia Warner  47 y.o. female  PRE-OPERATIVE DIAGNOSIS:  Degenerative disc disease with radiculopathy L5-S1  POST-OPERATIVE DIAGNOSIS:  Degenerative disc disease with radiculopathy L5-S1  PROCEDURE:  Procedure(s) with comments: TRANSFORAMINAL LUMBAR INTERBODY FUSION LUMBAR FIVE TO SACRAL ONE (N/A) - 4 hrs 3 C-Bed  SURGEON:  Surgeons and Role:    Venita Lick, MD - Primary  PHYSICIAN ASSISTANT:   ASSISTANTS: Luther Bradley   ANESTHESIA:   general  EBL:  75 mL   BLOOD ADMINISTERED:none  DRAINS: none   LOCAL MEDICATIONS USED:  MARCAINE     SPECIMEN:  No Specimen  DISPOSITION OF SPECIMEN:  N/A  COUNTS:  YES  TOURNIQUET:  * No tourniquets in log *  DICTATION: .Dragon Dictation  PLAN OF CARE: Admit to inpatient   PATIENT DISPOSITION:  PACU - hemodynamically stable.

## 2023-02-17 ENCOUNTER — Encounter (HOSPITAL_COMMUNITY): Payer: Self-pay | Admitting: Orthopedic Surgery

## 2023-02-17 MED ORDER — FLEET ENEMA RE ENEM: 1.0000 | ENEMA | Freq: Every day | RECTAL | Status: DC | PRN

## 2023-02-17 NOTE — Evaluation (Signed)
Physical Therapy Evaluation  Patient Details Name: Patricia Warner MRN: [id number] DOB: [birth year] Today's Date: 02/17/2023  History of Present Illness  Pt is a 47 y/o female who presents s/p L5-S1 TLIF on 02/16/23. PMHx: heart murmur, HTN  Clinical Impression  Pt admitted with above diagnosis. At the time of PT eval, pt was able to demonstrate transfers and ambulation with gross CGA to supervision for safety and no AD. Pt was educated on precautions, brace application/wearing schedule, appropriate activity progression, and car transfer. Pt currently with functional limitations due to the deficits listed below (see PT Problem List). Pt will benefit from skilled PT to increase their independence and safety with mobility to allow discharge to the venue listed below.          If plan is discharge home, recommend the following: A little help with walking and/or transfers;A little help with bathing/dressing/bathroom;Assistance with cooking/housework;Assist for transportation;Help with stairs or ramp for entrance   Can travel by private vehicle        Equipment Recommendations None recommended by PT  Recommendations for Other Services       Functional Status Assessment Patient has had a recent decline in their functional status and demonstrates the ability to make significant improvements in function in a reasonable and predictable amount of time.     Precautions / Restrictions Precautions Precautions: Fall;Back Precaution Booklet Issued: Yes (comment) Precaution Comments: Reviewed handout and pt was cued for precautions during functional mobility. Required Braces or Orthoses: Spinal Brace Spinal Brace: Lumbar corset;Applied in sitting position Restrictions Weight Bearing Restrictions: No      Mobility  Bed Mobility Overal bed mobility: Needs Assistance Bed Mobility: Rolling, Sit to Sidelying Rolling: Modified independent (Device/Increase time)       Sit to sidelying:  Supervision General bed mobility comments: HOB slightly elevated and use of rails required. VC's for optimal log roll technique.    Transfers Overall transfer level: Needs assistance Equipment used: None Transfers: Sit to/from Stand Sit to Stand: Supervision           General transfer comment: VC's for wide BOS and hand placement on seated surface for safety and improved control during sit<>stand.    Ambulation/Gait Ambulation/Gait assistance: Contact guard assist, Supervision Gait Distance (Feet): 300 Feet Assistive device: None Gait Pattern/deviations: Step-through pattern, Decreased stride length, Trunk flexed Gait velocity: Decreased Gait velocity interpretation: 1.31 - 2.62 ft/sec, indicative of limited community ambulator   General Gait Details: VC's for improved posture intermittently. Pt appears steady and without overt LOB.  Stairs Stairs: Yes Stairs assistance: Contact guard assist Stair Management: One rail Right, Step to pattern, Forwards Number of Stairs: 10 General stair comments: VC's for sequencing and general safety.  Wheelchair Mobility     Tilt Bed    Modified Rankin (Stroke Patients Only)       Balance Overall balance assessment: Needs assistance Sitting-balance support: Feet supported Sitting balance-Leahy Scale: Good     Standing balance support: No upper extremity supported, During functional activity Standing balance-Leahy Scale: Fair                               Pertinent Vitals/Pain Pain Assessment Pain Assessment: Faces Faces Pain Scale: Hurts even more Pain Location: back Pain Descriptors / Indicators: Discomfort Pain Intervention(s): Limited activity within patient's tolerance, Monitored during session, Repositioned    Home Living Family/patient expects to be discharged to:: Private residence Living Arrangements: Spouse/significant other Available Help at  Discharge: Family;Available 24 hours/day Type of Home:  House Home Access: Stairs to enter Entrance Stairs-Rails: Right;Left Entrance Stairs-Number of Steps: 6 Alternate Level Stairs-Number of Steps: flight Home Layout: Two level;Able to live on main level with bedroom/bathroom Home Equipment: None      Prior Function Prior Level of Function : Independent/Modified Independent;Driving;Working/employed             Mobility Comments: indep 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 [employer] Extremity Assessment Upper Extremity Assessment: Overall WFL for tasks assessed    Lower Extremity Assessment Lower Extremity Assessment: Generalized weakness (Mild; consistent with pre-op diagnosis)    Cervical / Trunk Assessment Cervical / Trunk Assessment: Back Surgery  Communication   Communication Communication: No apparent difficulties  Cognition Arousal: Alert Behavior During Therapy: WFL for tasks assessed/performed Overall Cognitive Status: Within Functional Limits for tasks assessed                                          General Comments General comments (skin integrity, edema, etc.): VSS onRA    Exercises     Assessment/Plan    PT Assessment Patient needs continued PT services  PT Problem List Decreased strength;Decreased activity tolerance;Decreased balance;Decreased mobility;Decreased knowledge of use of DME;Decreased safety awareness;Decreased knowledge of precautions;Pain       PT Treatment Interventions DME instruction;Gait training;Stair training;Functional mobility training;Therapeutic activities;Balance training;Therapeutic exercise;Patient/family education    PT Goals (Current goals can be found in the Care Plan section)  Acute Rehab PT Goals Patient Stated Goal: Return home at d/c PT Goal Formulation: With patient Time For Goal Achievement: 02/24/23 Potential to Achieve Goals: Good    Frequency Min 5X/week     Co-evaluation               AM-PAC  PT "6 Clicks" Mobility  Outcome Measure Help needed turning from your back to your side while in a flat bed without using bedrails?: None Help needed moving from lying on your back to sitting on the side of a flat bed without using bedrails?: A Little Help needed moving to and from a bed to a chair (including a wheelchair)?: A Little Help needed standing up from a chair using your arms (e.g., wheelchair or bedside chair)?: A Little Help needed to walk in hospital room?: A Little Help needed climbing 3-5 steps with a railing? : A Little 6 Click Score: 19    End of Session Equipment Utilized During Treatment: Gait belt;Back brace Activity Tolerance: Patient tolerated treatment well Patient left: in bed;with call bell/phone within reach Nurse Communication: Mobility status PT Visit Diagnosis: Unsteadiness on feet (R26.81);Pain Pain - part of body:  (back)    Time: 1049-1110 PT Time Calculation (min) (ACUTE ONLY): 21 min   Charges:   PT Evaluation $PT Eval Low Complexity: 1 Low   PT General Charges $$ ACUTE PT VISIT: 1 Visit         Conni Slipper, PT, DPT Acute Rehabilitation Services Secure Chat Preferred Office: [phone]   Marylynn Pearson 02/17/2023, 12:09 PM

## 2023-02-17 NOTE — Progress Notes (Signed)
    Subjective: Procedure(s) (LRB): TRANSFORAMINAL LUMBAR INTERBODY FUSION LUMBAR FIVE TO SACRAL ONE (N/A) 1 Day Post-Op  Patient reports pain as 4 on 0-10 scale.  Reports decreased leg pain reports incisional back pain   Positive void Negative bowel movement Positive flatus Negative chest pain or shortness of breath  Objective: Vital signs in last 24 hours: Temp:  [97.6 F (36.4 C)-98.4 F (36.9 C)] 98.4 F (36.9 C) (10/01 0721) Pulse Rate:  [68-94] 94 (10/01 0721) Resp:  [9-22] 16 (10/01 0721) BP: (115-149)/(72-91) 115/72 (10/01 0721) SpO2:  [89 %-99 %] 97 % (10/01 0721)  Intake/Output from previous day: 09/30 0701 - 10/01 0700 In: 240 [P.O.:240] Out: 1975 [Urine:1900; Blood:75]  Labs: Recent Labs    02/16/23 0618  HCT 36.0   Recent Labs    02/16/23 0618  NA 138  K 2.9*  CL 101  BUN 8  CREATININE 0.70  GLUCOSE 125*   No results for input(s): "LABPT", "INR" in the last 72 hours.  Physical Exam: Neurologically intact ABD soft Intact pulses distally Incision: dressing C/D/I and no drainage Compartment soft Body mass index is 36.64 kg/m.   Assessment/Plan: Patient stable  xrays n/a Continue mobilization with physical therapy Continue care  Overall patient is doing well.  She is neurologically intact.  Plan on working with physical therapy today especially with navigating stairs.  Also use enema for constipation.  Plan on discharge to home tomorrow.  Patricia Lick, MD [facility] [phone]

## 2023-02-17 NOTE — Evaluation (Signed)
Occupational Therapy Evaluation Patient Details Name: Patricia Warner MRN: [id number] DOB: 01-May-1976 Today's Date: 02/17/2023   History of Present Illness Patricia Warner  47 y.o. female who underwent TLIF L5-S1 09/30. PMHx: heart murmur, HTN   Clinical Impression   Patricia Warner was evaluated s/p the above admission list. She is indep, works and lives with family at baseline. Upon evaluation the pt was limited by back pain, unsteady balance and decreased activity tolerance. Overall she needed superivsion A - CGA for bed mobility, transfers and mobility without AD. Due to the deficits listed below the pt also needs up to minA for LB ADLs due to back pain and LLE stiffness. Cues and education provided throughout for spinal precautions and compensatory techniques, pt recalled good understanding. Pt will benefit from continued acute OT services and discharge home with support of family.         If plan is discharge home, recommend the following: A little help with bathing/dressing/bathroom;Assistance with cooking/housework;Assist for transportation    Functional Status Assessment  Patient has had a recent decline in their functional status and demonstrates the ability to make significant improvements in function in a reasonable and predictable amount of time.  Equipment Recommendations  None recommended by OT       Precautions / Restrictions Precautions Precautions: Fall Restrictions Weight Bearing Restrictions: No      Mobility Bed Mobility Overal bed mobility: Needs Assistance Bed Mobility: Rolling, Sidelying to Sit Rolling: Supervision Sidelying to sit: Supervision       General bed mobility comments: cues for log roll    Transfers Overall transfer level: Needs assistance Equipment used: None Transfers: Sit to/from Stand Sit to Stand: Supervision                  Balance Overall balance assessment: Needs assistance Sitting-balance support: Feet supported Sitting  balance-Leahy Scale: Good     Standing balance support: No upper extremity supported, During functional activity Standing balance-Leahy Scale: Fair                             ADL either performed or assessed with clinical judgement   ADL Overall ADL's : Needs assistance/impaired Eating/Feeding: Independent   Grooming: Supervision/safety;Standing   Upper Body Bathing: Set up;Standing   Lower Body Bathing: Minimal assistance;Sit to/from stand   Upper Body Dressing : Set up;Sitting   Lower Body Dressing: Minimal assistance;Sit to/from stand   Toilet Transfer: Supervision/safety;Ambulation   Toileting- Clothing Manipulation and Hygiene: Supervision/safety;Sitting/lateral lean       Functional mobility during ADLs: Contact guard assist General ADL Comments: min A for LB ADLs to maintain spinal precautions     Vision Baseline Vision/History: 0 No visual deficits Vision Assessment?: No apparent visual deficits     Perception Perception: Within Functional Limits       Praxis Praxis: WFL       Pertinent Vitals/Pain Pain Assessment Pain Assessment: Faces Faces Pain Scale: Hurts even more Pain Location: back Pain Descriptors / Indicators: Discomfort Pain Intervention(s): Limited activity within patient's tolerance, Monitored during session     Extremity/Trunk Assessment Upper Extremity Assessment Upper Extremity Assessment: Overall WFL for tasks assessed   Lower Extremity Assessment Lower Extremity Assessment: Generalized weakness   Cervical / Trunk Assessment Cervical / Trunk Assessment: Back Surgery   Communication Communication Communication: No apparent difficulties   Cognition Arousal: Alert Behavior During Therapy: WFL for tasks assessed/performed Overall Cognitive Status: Within Functional Limits for tasks assessed  General Comments  VSS onRA     Home Living Family/patient expects to be discharged to:: Private  residence Living Arrangements: Spouse/significant other Available Help at Discharge: Family;Available 24 hours/day Type of Home: House Home Access: Stairs to enter [employer] of Steps: 6 Entrance Stairs-Rails: Right;Left Home Layout: Two level;Able to live on main level with bedroom/bathroom Alternate Level Stairs-Number of Steps: flight Alternate Level Stairs-Rails: Right Bathroom Shower/Tub: Producer, television/film/video: Standard     Home Equipment: None          Prior Functioning/Environment Prior Level of Function : Independent/Modified Independent;Driving;Working/employed             Mobility Comments: indep 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 Problem List: Decreased strength;Decreased range of motion;Decreased activity tolerance;Decreased knowledge of use of DME or AE;Decreased knowledge of precautions;Decreased safety awareness      OT Treatment/Interventions: Self-care/ADL training;Therapeutic exercise;DME and/or AE instruction;Therapeutic activities;Patient/family education;Balance training    OT Goals(Current goals can be found in the care plan section) Acute Rehab OT Goals Patient Stated Goal: home tomorrow OT Goal Formulation: With patient Time For Goal Achievement: 03/03/23 Potential to Achieve Goals: Good ADL Goals Additional ADL Goal #1: Pt will complete all ADLs with mod I while maintaining back precuations  OT Frequency: Min 1X/week    Co-evaluation           AM-PAC OT "6 Clicks" Daily Activity     Outcome Measure Help from another person eating meals?: None Help from another person taking care of personal grooming?: A Little Help from another person toileting, which includes using toliet, bedpan, or urinal?: A Little Help from another person bathing (including washing, rinsing, drying)?: A Little Help from another person to put on and taking off regular upper body clothing?: A Little Help from another person  to put on and taking off regular lower body clothing?: A Little 6 Click Score: 19   End of Session Equipment Utilized During Treatment: Back brace Nurse Communication: Mobility status  Activity Tolerance: Patient tolerated treatment well Patient left: in chair;with call bell/phone within reach  OT Visit Diagnosis: Unsteadiness on feet (R26.81);Other abnormalities of gait and mobility (R26.89);Muscle weakness (generalized) (M62.81)                Time: 1324-4010 OT Time Calculation (min): 14 min Charges:  OT General Charges $OT Visit: 1 Visit OT Evaluation $OT Eval Moderate Complexity: 1 Mod  Derenda Mis, OTR/L Acute Rehabilitation Services Office [phone] Secure Chat Communication Preferred   Donia Pounds 02/17/2023, 11:49 AM

## 2023-02-18 MED ORDER — OXYCODONE-ACETAMINOPHEN 10-325 MG PO TABS: 1.0000 | ORAL_TABLET | Freq: Four times a day (QID) | ORAL | 0 refills | Status: AC | PRN

## 2023-02-18 MED ORDER — ONDANSETRON HCL 4 MG PO TABS: 4.0000 mg | ORAL_TABLET | Freq: Three times a day (TID) | ORAL | 0 refills | Status: AC | PRN

## 2023-02-18 MED ORDER — METHOCARBAMOL 500 MG PO TABS: 500.0000 mg | ORAL_TABLET | Freq: Three times a day (TID) | ORAL | 0 refills | Status: AC | PRN

## 2023-02-18 NOTE — Progress Notes (Signed)
Occupational Therapy Treatment & Discharge  Patient Details Name: Patricia Warner MRN: [id number] DOB: 07-07-1975 Today's Date: 02/18/2023   History of present illness Pt is a 47 y/o female who presents s/p L5-S1 TLIF on 02/16/23. PMHx: heart murmur, HTN   OT comments  Overall pt demonstrated mod I ability to complete ADLs and mobility with great recall of compensatory techniques. Educated and reviewed do's/don'ts for IADLs including driving, household chores, childcare etc., pt verbalized understanding.  Pt does not require further acute OT services, will sign off acutely. Recommend d/c home with support of family.        If plan is discharge home, recommend the following:  A little help with bathing/dressing/bathroom;Assistance with cooking/housework;Assist for transportation   Equipment Recommendations  None recommended by OT       Precautions / Restrictions Precautions Precautions: Fall;Back Precaution Booklet Issued: Yes (comment) Precaution Comments: Reviewed handout and pt was cued for precautions during functional mobility. Required Braces or Orthoses: Spinal Brace Spinal Brace: Lumbar corset;Applied in sitting position Restrictions Weight Bearing Restrictions: No       Mobility Bed Mobility Overal bed mobility: Modified Independent        Transfers Overall transfer level: Needs assistance Equipment used: None Transfers: Sit to/from Stand Sit to Stand: Supervision                 Balance Overall balance assessment: Needs assistance Sitting-balance support: Feet supported Sitting balance-Leahy Scale: Good     Standing balance support: No upper extremity supported, During functional activity Standing balance-Leahy Scale: Fair         ADL either performed or assessed with clinical judgement   ADL Overall ADL's : Modified independent                   General ADL Comments: mod I for ADLs with good use of compensatory techniques     Extremity/Trunk Assessment Upper Extremity Assessment Upper Extremity Assessment: Overall WFL for tasks assessed   Lower Extremity Assessment Lower Extremity Assessment: Defer to PT evaluation        Vision   Vision Assessment?: No apparent visual deficits   Perception Perception Perception: Within Functional Limits   Praxis Praxis Praxis: WFL    Cognition Arousal: Alert Behavior During Therapy: WFL for tasks assessed/performed Overall Cognitive Status: Within Functional Limits for tasks assessed       General Comments: great recall of BLT              General Comments VSS on RA    Pertinent Vitals/ Pain       Pain Assessment Pain Assessment: Faces Faces Pain Scale: Hurts a little bit Pain Location: back Pain Descriptors / Indicators: Discomfort Pain Intervention(s): Limited activity within patient's tolerance, Monitored during session   Frequency  Min 1X/week        Progress Toward Goals  OT Goals(current goals can now be found in the care plan section)  Progress towards OT goals: Goals met/education completed, patient discharged from OT  Acute Rehab OT Goals Patient Stated Goal: home today OT Goal Formulation: With patient Potential to Achieve Goals: Good ADL Goals Additional ADL Goal #1: Pt will complete all ADLs with mod I while maintaining back precuations   AM-PAC OT "6 Clicks" Daily Activity     Outcome Measure   Help from another person eating meals?: None Help from another person taking care of personal grooming?: None Help from another person toileting, which includes using toliet, bedpan, or urinal?: None Help  from another person bathing (including washing, rinsing, drying)?: None Help from another person to put on and taking off regular upper body clothing?: None Help from another person to put on and taking off regular lower body clothing?: None 6 Click Score: 24    End of Session Equipment Utilized During Treatment: Back  brace  OT Visit Diagnosis: Unsteadiness on feet (R26.81);Other abnormalities of gait and mobility (R26.89);Muscle weakness (generalized) (M62.81)   Activity Tolerance Patient tolerated treatment well   Patient Left in bed;with call bell/phone within reach   Nurse Communication Mobility status        Time: 2952-8413 OT Time Calculation (min): 14 min  Charges: OT General Charges $OT Visit: 1 Visit OT Treatments $Self Care/Home Management : 8-22 mins  Derenda Mis, OTR/L Acute Rehabilitation Services Office [phone] Secure Chat Communication Preferred   Donia Pounds 02/18/2023, 10:18 AM

## 2023-02-18 NOTE — Plan of Care (Signed)
Pt doing well. Pt given D/C instructions with verbal understanding. Rx's were given to the Pt at D/C. Pt's incision is clean and dry with no sign of infection. Pt's IV was removed prior to D/C. Pt D/C'd home via wheelchair per MD order. Pt is stable @ D/C and has no other needs at this time. Holli Humbles, RN

## 2023-02-18 NOTE — Progress Notes (Signed)
Physical Therapy Treatment  Patient Details Name: Patricia Warner MRN: [id number] DOB: 08/15/1975 Today's Date: 02/18/2023   History of Present Illness Pt is a 47 y/o female who presents s/p L5-S1 TLIF on 02/16/23. PMHx: heart murmur, HTN    PT Comments  Pt progressing well with post-op mobility. She was able to demonstrate transfers and ambulation with gross modified independence and no AD. Reinforced education on precautions, brace application/wearing schedule, appropriate activity progression, and car transfer. Will continue to follow.      If plan is discharge home, recommend the following: A little help with walking and/or transfers;A little help with bathing/dressing/bathroom;Assistance with cooking/housework;Assist for transportation;Help with stairs or ramp for entrance   Can travel by private vehicle        Equipment Recommendations  None recommended by PT    Recommendations for Other Services       Precautions / Restrictions Precautions Precautions: Fall;Back Precaution Booklet Issued: Yes (comment) Precaution Comments: Reviewed handout and pt was cued for precautions during functional mobility. Required Braces or Orthoses: Spinal Brace Spinal Brace: Lumbar corset;Applied in sitting position Restrictions Weight Bearing Restrictions: No     Mobility  Bed Mobility Overal bed mobility: Modified Independent Bed Mobility: Rolling, Sit to Sidelying Rolling: Modified independent (Device/Increase time) Sidelying to sit: Modified independent (Device/Increase time)       General bed mobility comments: HOB flat and rails lowered to simulate home environment.    Transfers Overall transfer level: Modified independent Equipment used: None Transfers: Sit to/from Stand Sit to Stand: Supervision           General transfer comment: Pt demonstrated good posture and maintenance of precautions during sit<>stand.    Ambulation/Gait Ambulation/Gait assistance: Modified  independent (Device/Increase time) Gait Distance (Feet): 300 Feet Assistive device: None Gait Pattern/deviations: Step-through pattern, Decreased stride length, Trunk flexed Gait velocity: Decreased Gait velocity interpretation: 1.31 - 2.62 ft/sec, indicative of limited community ambulator   General Gait Details: Pt slow and guarded due to reported 10/10 pain but overall functional and without overt LOB.   Stairs   Stairs assistance: Contact guard assist Stair Management: One rail Right, Step to pattern, Forwards   General stair comments: VC's for sequencing and general safety.   Wheelchair Mobility     Tilt Bed    Modified Rankin (Stroke Patients Only)       Balance Overall balance assessment: Needs assistance Sitting-balance support: Feet supported Sitting balance-Leahy Scale: Good     Standing balance support: No upper extremity supported, During functional activity Standing balance-Leahy Scale: Fair                              Cognition Arousal: Alert Behavior During Therapy: WFL for tasks assessed/performed Overall Cognitive Status: Within Functional Limits for tasks assessed                                          Exercises      General Comments General comments (skin integrity, edema, etc.): VSS on RA      Pertinent Vitals/Pain Pain Assessment Pain Assessment: 0-10 Pain Score: 10-Worst pain ever Faces Pain Scale: Hurts little more Pain Location: back Pain Descriptors / Indicators: Discomfort Pain Intervention(s): Limited activity within patient's tolerance, Monitored during session, Repositioned    Home Living  Prior Function            PT Goals (current goals can now be found in the care plan section) Acute Rehab PT Goals Patient Stated Goal: Return home at d/c - tpday. PT Goal Formulation: With patient Time For Goal Achievement: 02/24/23 Potential to Achieve Goals:  Good Progress towards PT goals: Progressing toward goals    Frequency    Min 5X/week      PT Plan      Co-evaluation              AM-PAC PT "6 Clicks" Mobility   Outcome Measure  Help needed turning from your back to your side while in a flat bed without using bedrails?: None Help needed moving from lying on your back to sitting on the side of a flat bed without using bedrails?: A Little Help needed moving to and from a bed to a chair (including a wheelchair)?: A Little Help needed standing up from a chair using your arms (e.g., wheelchair or bedside chair)?: A Little Help needed to walk in hospital room?: A Little Help needed climbing 3-5 steps with a railing? : A Little 6 Click Score: 19    End of Session Equipment Utilized During Treatment: Gait belt;Back brace Activity Tolerance: Patient tolerated treatment well Patient left: in bed;with call bell/phone within reach Nurse Communication: Mobility status PT Visit Diagnosis: Unsteadiness on feet (R26.81);Pain Pain - part of body:  (back)     Time: 1610-9604 PT Time Calculation (min) (ACUTE ONLY): 12 min  Charges:    $Gait Training: 8-22 mins PT General Charges $$ ACUTE PT VISIT: 1 Visit                     Conni Slipper, PT, DPT Acute Rehabilitation Services Secure Chat Preferred Office: [phone]    Patricia Warner 02/18/2023, 10:41 AM

## 2023-02-18 NOTE — Discharge Summary (Signed)
Patient ID: Patricia Warner MRN: [id number] DOB/AGE: 1975-12-24 47 y.o.  Admit date: 02/16/2023 Discharge date: 02/18/2023  Admission Diagnoses:  Principal Problem:   S/P lumbar fusion   Discharge Diagnoses:  Principal Problem:   S/P lumbar fusion  status post Procedure(s): TRANSFORAMINAL LUMBAR INTERBODY FUSION LUMBAR FIVE TO SACRAL ONE  Past Medical History:  Diagnosis Date   Heart murmur    Hypertension     Surgeries: Procedure(s): TRANSFORAMINAL LUMBAR INTERBODY FUSION LUMBAR FIVE TO SACRAL ONE on 02/16/2023   Consultants:   Discharged Condition: Improved  Hospital Course: Patricia Warner is an 47 y.o. female who was admitted 02/16/2023 for operative treatment of S/P lumbar fusion. Patient failed conservative treatments (please see the history and physical for the specifics) and had severe unremitting pain that affects sleep, daily activities and work/hobbies. After pre-op clearance, the patient was taken to the operating room on 02/16/2023 and underwent  Procedure(s): TRANSFORAMINAL LUMBAR INTERBODY FUSION LUMBAR FIVE TO SACRAL ONE.    Patient was given perioperative antibiotics:  Anti-infectives (From admission, onward)    Start     Dose/Rate Route Frequency Ordered Stop   02/16/23 1600  ceFAZolin (ANCEF) IVPB 1 g/50 mL premix        1 g 100 mL/hr over 30 Minutes Intravenous Every 8 hours 02/16/23 1333 02/16/23 2324   02/16/23 0545  ceFAZolin (ANCEF) IVPB 2g/100 mL premix  Status:  Discontinued        2 g 200 mL/hr over 30 Minutes Intravenous 30 min pre-op 02/16/23 0545 02/16/23 1326        Patient was given sequential compression devices and early ambulation to prevent DVT.   Patient benefited maximally from hospital stay and there were no complications. At the time of discharge, the patient was urinating/moving their bowels without difficulty, tolerating a regular diet, pain is controlled with oral pain medications and they have been cleared by PT/OT.   Recent  vital signs: Patient Vitals for the past 24 hrs:  BP Temp Temp src Pulse Resp SpO2  02/18/23 0752 102/69 98.2 F (36.8 C) Oral 75 16 96 %  02/18/23 0445 108/74 98.1 F (36.7 C) Oral 72 20 91 %  02/17/23 2253 116/74 98.3 F (36.8 C) Oral 68 19 94 %  02/17/23 2021 123/70 97.7 F (36.5 C) Oral 71 20 95 %  02/17/23 1631 125/72 98.3 F (36.8 C) Oral 73 16 95 %     Recent laboratory studies:  Recent Labs    02/16/23 0618  HGB 12.2  HCT 36.0  NA 138  K 2.9*  CL 101  BUN 8  CREATININE 0.70  GLUCOSE 125*     Discharge Medications:   Allergies as of 02/18/2023       Reactions   Coconut Fatty Acid Swelling   Pineapple Swelling   Latex Rash   Sulfa Antibiotics Hives, Rash        Medication List     STOP taking these medications    ibuprofen 800 MG tablet Commonly known as: ADVIL       TAKE these medications    furosemide 20 MG tablet Commonly known as: LASIX Take 20 mg by mouth 2 (two) times daily.   hydrochlorothiazide 25 MG tablet Commonly known as: HYDRODIURIL Take 1 tablet (25 mg total) by mouth daily.   methocarbamol 500 MG tablet Commonly known as: ROBAXIN Take 1 tablet (500 mg total) by mouth every 8 (eight) hours as needed for up to 5 days for muscle spasms.  ondansetron 4 MG tablet Commonly known as: Zofran Take 1 tablet (4 mg total) by mouth every 8 (eight) hours as needed for nausea or vomiting.   oxyCODONE-acetaminophen 10-325 MG tablet Commonly known as: Percocet Take 1 tablet by mouth every 6 (six) hours as needed for up to 5 days for pain.   verapamil 120 MG CR tablet Commonly known as: CALAN-SR Take 120 mg by mouth daily.        Diagnostic Studies: DG Lumbar Spine 2-3 Views  Result Date: 02/16/2023 CLINICAL DATA:  Elective surgery. EXAM: LUMBAR SPINE - 2-3 VIEW COMPARISON:  None Available. FINDINGS: Two fluoroscopic spot views of the lumbar spine obtained in the operating room in frontal and lateral projections. Assuming 5  non-rib-bearing lumbar vertebra there is posterior rod and intrapedicular screw fusion L5 and S1 with interbody spacer. Fluoroscopy time 2 minutes 38 seconds. Fluoroscopy dose 121.46 mGy IMPRESSION: Intraoperative fluoroscopic spot views during lumbar surgery. Electronically Signed   By: Narda Rutherford M.D.   On: 02/16/2023 15:44   DG C-Arm 1-60 Min-No Report  Result Date: 02/16/2023 Fluoroscopy was utilized by the requesting physician.  No radiographic interpretation.   DG C-Arm 1-60 Min-No Report  Result Date: 02/16/2023 Fluoroscopy was utilized by the requesting physician.  No radiographic interpretation.   DG C-Arm 1-60 Min-No Report  Result Date: 02/16/2023 Fluoroscopy was utilized by the requesting physician.  No radiographic interpretation.    Discharge Instructions     Incentive spirometry RT   Complete by: As directed          Discharge Plan:  discharge to home  Disposition: Patricia Warner is a very pleasant 47 year old woman who underwent a TLIF for back buttock and radicular leg pain.  Patient tolerated the procedure well and there was no complications.  She is now independently ambulating, voiding spontaneously, and had a positive bowel movement.  Patient is also tolerating a regular diet.  Her pain is controlled with oral medications.  She will be discharged home with appropriate medications as well as instructions.  She will follow-up with me in 2 weeks.    Signed: Alvy Beal for Dr. Venita Lick [facility] [phone] 02/18/2023, 8:07 AM

## 2023-03-04 ENCOUNTER — Encounter (HOSPITAL_COMMUNITY): Payer: Self-pay | Admitting: Orthopedic Surgery

## 2023-03-12 NOTE — Plan of Care (Signed)
[facility] Tonsillectomy/Adenoidectomy, Postoperative PEDS care plan entered in error.

## 2023-07-30 ENCOUNTER — Encounter (INDEPENDENT_AMBULATORY_CARE_PROVIDER_SITE_OTHER): Payer: Self-pay

## 2023-10-02 ENCOUNTER — Other Ambulatory Visit: Payer: Self-pay | Admitting: Orthopedic Surgery

## 2023-10-02 DIAGNOSIS — M545 Low back pain, unspecified: Secondary | ICD-10-CM

## 2023-10-30 ENCOUNTER — Ambulatory Visit
Admission: RE | Admit: 2023-10-30 | Discharge: 2023-10-30 | Disposition: A | Discharge status: Home / Self Care | Payer: Worker's Compensation | Source: Ambulatory Visit | Attending: Orthopedic Surgery | Admitting: Orthopedic Surgery

## 2023-10-30 DIAGNOSIS — M545 Low back pain, unspecified: Secondary | ICD-10-CM

## 2023-11-27 ENCOUNTER — Other Ambulatory Visit
